# Patient Record
Sex: Female | Born: 1980 | Race: White | Hispanic: No | Marital: Married | Smoking: Never smoker
Health system: Southern US, Community
[De-identification: ages and names within clinical notes are randomized; demographics above are authoritative.]

## PROBLEM LIST (undated history)

## (undated) DIAGNOSIS — F419 Anxiety disorder, unspecified: Secondary | ICD-10-CM

## (undated) DIAGNOSIS — R112 Nausea with vomiting, unspecified: Secondary | ICD-10-CM

## (undated) DIAGNOSIS — Z9889 Other specified postprocedural states: Secondary | ICD-10-CM

## (undated) HISTORY — DX: Anxiety disorder, unspecified: F41.9

---

## 2005-03-15 ENCOUNTER — Emergency Department: Payer: Self-pay | Admitting: Emergency Medicine

## 2007-10-26 HISTORY — PX: APPENDECTOMY: SHX54

## 2016-10-11 ENCOUNTER — Other Ambulatory Visit: Payer: Self-pay | Admitting: Oncology

## 2016-12-17 ENCOUNTER — Other Ambulatory Visit: Payer: Self-pay | Admitting: Oncology

## 2017-07-01 ENCOUNTER — Encounter: Payer: Self-pay | Admitting: Advanced Practice Midwife

## 2017-07-01 ENCOUNTER — Ambulatory Visit (INDEPENDENT_AMBULATORY_CARE_PROVIDER_SITE_OTHER): Payer: 59 | Admitting: Advanced Practice Midwife

## 2017-07-01 VITALS — BP 110/80 | HR 72 | Ht 64.0 in | Wt 183.0 lb

## 2017-07-01 DIAGNOSIS — Z30431 Encounter for routine checking of intrauterine contraceptive device: Secondary | ICD-10-CM

## 2017-07-01 DIAGNOSIS — R102 Pelvic and perineal pain: Secondary | ICD-10-CM | POA: Diagnosis not present

## 2017-07-01 NOTE — Progress Notes (Signed)
       IUD String Check  Subjctive: Laura Watson presents for IUD string check.  She had a Mirena placed 3 years ago.  Since placement of her IUD she had no vaginal bleeding.  She admits to cramping or discomfort in the past week. She denies any other gyn concerns, no PAP or STD concerns. She has not been back for an annual check since placement of the IUD  She denies any fever, chills, nausea, vomiting, or other complaints. She denies any s/s of UTI.  Objective: BP 110/80   Pulse 72   Ht 5\' 4"  (1.626 m)   Wt 183 lb (83 kg)   LMP  (LMP Unknown) Comment: iud  BMI 31.41 kg/m  Gen:  NAD, A&Ox3 HEENT: normocephalic, anicteric Pulmonary: no increased work of breathing Pelvic: Normal appearing external female genitalia, normal vaginal epithelium, brown/mucous discharge. Normal appearing cervix.  IUD strings visible and 3 cm in length similar to at the time of placement. Psychiatric: mood appropriate, affect full Neurologic: grossly normal   Assessment: 36 y.o. year old female status post prior Mirena IUD placement 3 years ago with pelvic pain and some evidence that she will have some bleeding or spotting.  Plan: 1.  She should call for fevers, chills, abnormal vaginal discharge, pelvic pain, or other complaints. 2.  Recommend she schedule an annual exam.  All questions answered.  Rod Can, CNM 07/01/2017 2:40 PM

## 2017-07-07 ENCOUNTER — Emergency Department
Admission: EM | Admit: 2017-07-07 | Discharge: 2017-07-07 | Disposition: A | Discharge status: Home / Self Care | Payer: 59 | Attending: Emergency Medicine | Admitting: Emergency Medicine

## 2017-07-07 ENCOUNTER — Emergency Department: Payer: 59

## 2017-07-07 ENCOUNTER — Encounter: Payer: Self-pay | Admitting: *Deleted

## 2017-07-07 DIAGNOSIS — R51 Headache: Secondary | ICD-10-CM | POA: Insufficient documentation

## 2017-07-07 DIAGNOSIS — Z79899 Other long term (current) drug therapy: Secondary | ICD-10-CM | POA: Insufficient documentation

## 2017-07-07 DIAGNOSIS — R519 Headache, unspecified: Secondary | ICD-10-CM

## 2017-07-07 LAB — BASIC METABOLIC PANEL
ANION GAP: 9 (ref 5–15)
BUN: 10 mg/dL (ref 6–20)
CHLORIDE: 107 mmol/L (ref 101–111)
CO2: 24 mmol/L (ref 22–32)
Calcium: 9 mg/dL (ref 8.9–10.3)
Creatinine, Ser: 0.7 mg/dL (ref 0.44–1.00)
GFR calc Af Amer: >60 mL/min (ref >60)
GFR calc non Af Amer: >60 mL/min (ref >60)
GLUCOSE: 89 mg/dL (ref 65–99)
POTASSIUM: 3.6 mmol/L (ref 3.5–5.1)
Sodium: 140 mmol/L (ref 135–145)

## 2017-07-07 LAB — CBC WITH DIFFERENTIAL/PLATELET
BASOS ABS: 0.1 10*3/uL (ref 0–0.1)
Basophils Relative: 1 %
EOS PCT: 1 %
Eosinophils Absolute: 0.1 10*3/uL (ref 0–0.7)
HEMATOCRIT: 38.9 % (ref 35.0–47.0)
HEMOGLOBIN: 13.1 g/dL (ref 12.0–16.0)
LYMPHS ABS: 3 10*3/uL (ref 1.0–3.6)
LYMPHS PCT: 22 %
MCH: 29.7 pg (ref 26.0–34.0)
MCHC: 33.7 g/dL (ref 32.0–36.0)
MCV: 88.3 fL (ref 80.0–100.0)
Monocytes Absolute: 0.7 10*3/uL (ref 0.2–0.9)
Monocytes Relative: 5 %
NEUTROS ABS: 9.7 10*3/uL — AB (ref 1.4–6.5)
NEUTROS PCT: 71 %
Platelets: 416 10*3/uL (ref 150–440)
RBC: 4.4 MIL/uL (ref 3.80–5.20)
RDW: 13.5 % (ref 11.5–14.5)
WBC: 13.5 10*3/uL — ABNORMAL HIGH (ref 3.6–11.0)

## 2017-07-07 LAB — URINALYSIS, COMPLETE (UACMP) WITH MICROSCOPIC
Bilirubin Urine: NEGATIVE
GLUCOSE, UA: NEGATIVE mg/dL
Hgb urine dipstick: NEGATIVE
KETONES UR: NEGATIVE mg/dL
LEUKOCYTES UA: NEGATIVE
Nitrite: NEGATIVE
PROTEIN: NEGATIVE mg/dL
Specific Gravity, Urine: 1.015 (ref 1.005–1.030)
pH: 7 (ref 5.0–8.0)

## 2017-07-07 LAB — POCT PREGNANCY, URINE: PREG TEST UR: NEGATIVE

## 2017-07-07 MED ORDER — KETOROLAC TROMETHAMINE 30 MG/ML IJ SOLN: 15.0000 mg | Freq: Once | INTRAMUSCULAR | Status: DC

## 2017-07-07 MED ORDER — SODIUM CHLORIDE 0.9 % IV BOLUS (SEPSIS)
1000.0000 mL | Freq: Once | INTRAVENOUS | Status: AC
Start: 2017-07-07 — End: 2017-07-07
  Administered 2017-07-07: 1000 mL via INTRAVENOUS

## 2017-07-07 MED ORDER — KETOROLAC TROMETHAMINE 30 MG/ML IJ SOLN
15.0000 mg | Freq: Once | INTRAMUSCULAR | Status: AC
Start: 2017-07-07 — End: 2017-07-07
  Administered 2017-07-07: 15 mg via INTRAVENOUS
  Filled 2017-07-07: qty 1

## 2017-07-07 MED ORDER — DIPHENHYDRAMINE HCL 50 MG/ML IJ SOLN
25.0000 mg | Freq: Once | INTRAMUSCULAR | Status: AC
  Administered 2017-07-07: 25 mg via INTRAVENOUS
  Filled 2017-07-07: qty 1

## 2017-07-07 MED ORDER — METOCLOPRAMIDE HCL 5 MG/ML IJ SOLN
10.0000 mg | Freq: Once | INTRAMUSCULAR | Status: AC
  Administered 2017-07-07: 10 mg via INTRAVENOUS
  Filled 2017-07-07: qty 2

## 2017-07-07 NOTE — ED Triage Notes (Signed)
Pt has a headache for 5 days.  Pt was seen at [facility] this week and started on meds.  States no relief of pain.  Pt has nausea.  No v/d.  Pt alert.   Speech clear.

## 2017-07-07 NOTE — Discharge Instructions (Signed)
You have been seen in the Emergency Department (ED) for a headache. Your evaluation today was overall reassuring. Headaches have many possible causes. Most headaches aren't a sign of a more serious problem, and they will get better on their own.   Follow-up with your doctor in 12-24 hours if you are still having a headache. Otherwise follow up with your doctor in 3-5 days.  For pain take 600 mg of ibuprofen every 6 hours or 1000mg  of tylenol every 8 hours  When should you call for help?  Call 911 or return to the ED anytime you think you may need emergency care. For example, call if:  You have signs of a stroke. These may include:  Sudden numbness, paralysis, or weakness in your face, arm, or leg, especially on only one side of your body.  Sudden vision changes.  Sudden trouble speaking.  Sudden confusion or trouble understanding simple statements.  Sudden problems with walking or balance.  A sudden, severe headache that is different from past headaches. You have new or worsening headache Nausea and vomiting associated with your headache Fever, neck stiffness associated with your headache  Call your doctor now or seek immediate medical care if:  You have a new or worse headache.  Your headache gets much worse.  How can you care for yourself at home?  Do not drive if you have taken a prescription pain medicine.  Rest in a quiet, dark room until your headache is gone. Close your eyes and try to relax or go to sleep. Don't watch TV or read.  Put a cold, moist cloth or cold pack on the painful area for 10 to 20 minutes at a time. Put a thin cloth between the cold pack and your skin.  Use a warm, moist towel or a heating pad set on low to relax tight shoulder and neck muscles.  Have someone gently massage your neck and shoulders.  Take pain medicines exactly as directed.  If the doctor gave you a prescription medicine for pain, take it as prescribed.  If you are not taking a prescription  pain medicine, ask your doctor if you can take an over-the-counter medicine. Be careful not to take pain medicine more often than the instructions allow, because you may get worse or more frequent headaches when the medicine wears off.  Do not ignore new symptoms that occur with a headache, such as a fever, weakness or numbness, vision changes, or confusion. These may be signs of a more serious problem.  To prevent headaches  Keep a headache diary so you can figure out what triggers your headaches. Avoiding triggers may help you prevent headaches. Record when each headache began, how long it lasted, and what the pain was like (throbbing, aching, stabbing, or dull). Write down any other symptoms you had with the headache, such as nausea, flashing lights or dark spots, or sensitivity to bright light or loud noise. Note if the headache occurred near your period. List anything that might have triggered the headache, such as certain foods (chocolate, cheese, wine) or odors, smoke, bright light, stress, or lack of sleep.  Find healthy ways to deal with stress. Headaches are most common during or right after stressful times. Take time to relax before and after you do something that has caused a headache in the past.  Try to keep your muscles relaxed by keeping good posture. Check your jaw, face, neck, and shoulder muscles for tension, and try relaxing them. When sitting at a  desk, change positions often, and stretch for 30 seconds each hour.  Get plenty of sleep and exercise.  Eat regularly and well. Long periods without food can trigger a headache.  Treat yourself to a massage. Some people find that regular massages are very helpful in relieving tension.  Limit caffeine by not drinking too much coffee, tea, or soda. But don't quit caffeine suddenly, because that can also give you headaches.  Reduce eyestrain from computers by blinking frequently and looking away from the computer screen every so often. Make  sure you have proper eyewear and that your monitor is set up properly, about an arm's length away.  Seek help if you have depression or anxiety. Your headaches may be linked to these conditions. Treatment can both prevent headaches and help with symptoms of anxiety or depression.

## 2017-07-07 NOTE — ED Provider Notes (Signed)
[facility] Emergency Department Provider Note  ____________________________________________  Time seen: Approximately 6:04 PM  I have reviewed the triage vital signs and the nursing notes.   HISTORY  Chief Complaint Headache   HPI Laura Watson is a 36 y.o. female no significant past medical history who presents for evaluation of headache. Patient reports that she has had a headache constantly for 5 days. She describes it as throbbing, located in the base of her skull, radiating to the top of her head, worse when she turns her head to the left, currently 8 out of 10. She has had nausea but no vomiting, no photophobia. No fh aneurysms, brain cancer, or stroke. She denies having history of chronic headaches or migraines. She denies ever having similar headache. 2 days ago she went to urgent care and she was started on Fioricet, Sudafed, and Afrin for possible sinus headache. She denies congestion, cough, fever. The HA started before bedtime 5 days ago. Patient was home getting ready for bed. Patient denies changes in vision, weakness or numbness, sudden onset HA or HA with maximal intensity at onset, fever, neck stiffness, history of immunosuppression, Jaw claudication, muscle aches, temporal artery pain, history of other household members with similar symptoms, pregnancy, clotting disorder, trauma, eye pain, recent cervical manipulation, or dizziness with headache.   No past medical history on file.  There are no active problems to display for this patient.   Past Surgical History:  Procedure Laterality Date  . APPENDECTOMY  2009  . CESAREAN SECTION     x2    Prior to Admission medications   Medication Sig Start Date End Date Taking? Authorizing Provider  acetaminophen (TYLENOL) 325 MG tablet Take 650 mg by mouth as needed.   Yes [provider]  Butalbital-APAP-Caffeine 50-300-40 MG CAPS Take 1 tablet by mouth daily as needed. 07/05/17  Yes  [provider]  fluticasone (FLONASE) 50 MCG/ACT nasal spray Place 1-2 sprays into both nostrils daily. 07/06/17  Yes [provider]  ibuprofen (ADVIL,MOTRIN) 200 MG tablet Take 400 mg by mouth as needed.   Yes [provider]  levonorgestrel (MIRENA) 20 MCG/24HR IUD 1 Intra Uterine Device by Intrauterine route continuous.   Yes [provider]    Allergies Patient has no known allergies.  Family History  Problem Relation Age of Onset  . Diabetes Mother   . Cancer Mother 51       lymphoma  . Diabetes Father     Social History Social History  Substance Use Topics  . Smoking status: Never Smoker  . Smokeless tobacco: Never Used  . Alcohol use Yes     Comment: occ    Review of Systems  Constitutional: Negative for fever. Eyes: Negative for visual changes. ENT: Negative for sore throat. Neck: No neck pain  Cardiovascular: Negative for chest pain. Respiratory: Negative for shortness of breath. Gastrointestinal: Negative for abdominal pain, vomiting or diarrhea. Genitourinary: Negative for dysuria. Musculoskeletal: Negative for back pain. Skin: Negative for rash. Neurological: Negative for weakness or numbness. + HA Psych: No SI or HI  ____________________________________________   PHYSICAL EXAM:  VITAL SIGNS: ED Triage Vitals  Enc Vitals Group     BP 07/07/17 1606 (!) 143/78     Pulse Rate 07/07/17 1606 83     Resp 07/07/17 1606 16     Temp 07/07/17 1606 98.6 F (37 C)     Temp Source 07/07/17 1606 Oral     SpO2 07/07/17 1606 100 %  Weight 07/07/17 1607 180 lb (81.6 kg)     Height 07/07/17 1607 5\' 4"  (1.626 m)     Head Circumference --      Peak Flow --      Pain Score 07/07/17 1605 9     Pain Loc --      Pain Edu? --      Excl. in [place]? --     Constitutional: Alert and oriented. Well appearing and in no apparent distress. HEENT:      Head: Normocephalic and atraumatic.         Eyes: Conjunctivae are normal. Sclera  is non-icteric.       Mouth/Throat: Mucous membranes are moist.       Neck: Supple with no signs of meningismus. No neck stiffness or torticollis. Cardiovascular: Regular rate and rhythm. No murmurs, gallops, or rubs. 2+ symmetrical distal pulses are present in all extremities. No JVD. Respiratory: Normal respiratory effort. Lungs are clear to auscultation bilaterally. No wheezes, crackles, or rhonchi.  Gastrointestinal: Soft, non tender, and non distended with positive bowel sounds. No rebound or guarding. Genitourinary: No CVA tenderness. Musculoskeletal: Nontender with normal range of motion in all extremities. No edema, cyanosis, or erythema of extremities. Neurologic: Normal speech and language. A & O x3, EOMI, PERRL, no nystagmus, CN II-XII intact, motor testing reveals good tone and bulk throughout. There is no evidence of pronator drift or dysmetria. Muscle strength is 5/5 throughout. Sensory examination is intact. Gait is normal. Skin: Skin is warm, dry and intact. No rash noted. Psychiatric: Mood and affect are normal. Speech and behavior are normal.  ____________________________________________   LABS (all labs ordered are listed, but only abnormal results are displayed)  Labs Reviewed  CBC WITH DIFFERENTIAL/PLATELET - Abnormal; Notable for the following:       Result Value   WBC 13.5 (*)    Neutro Abs 9.7 (*)    All other components within normal limits  URINALYSIS, COMPLETE (UACMP) WITH MICROSCOPIC - Abnormal; Notable for the following:    Squamous Epithelial / LPF 0-5 (*)    Bacteria, UA RARE (*)    All other components within normal limits  BASIC METABOLIC PANEL  POCT PREGNANCY, URINE   ____________________________________________  EKG  none ____________________________________________  RADIOLOGY  Head CT: Negative head CT. ____________________________________________   PROCEDURES  Procedure(s) performed: None Procedures Critical Care performed:   None ____________________________________________   INITIAL IMPRESSION / ASSESSMENT AND PLAN / ED COURSE  36 y.o. female no significant past medical history who presents for evaluation of headache x 5 days located in the posterior aspect at the base of the skull and radiating to the top of her head.patient is well-appearing, she has normal vital signs, her neck is soft with no meningeal signs, no evidence of torticollis, she is neurologically intact. This patient has had this headache for 5 days and despite trying several different medications at home, will send her for head CT and check basic blood work. If pregnancy test is negative and CT is negative we'll treat with Toradol, Reglan, fluids, and Benadryl.   _________________________ 8:08 PM on 07/07/2017 -----------------------------------------  labs in no acute findings other than mild leukocytosis.patient has had no fever, no URI symptoms, no neck stiffness, no rash. Unclear why her white count is slightly elevated but there is no signs or symptoms of infection at this time. CT head with no acute findings. Her headache has fully resolved with a migraine cocktail patient is requesting discharge. She is to follow-up  with her primary care doctor. Recommend he return precautions for new or worsening headaches.  Pertinent labs & imaging results that were available during my care of the patient were reviewed by me and considered in my medical decision making (see chart for details).    ____________________________________________   FINAL CLINICAL IMPRESSION(S) / ED DIAGNOSES  Final diagnoses:  Acute nonintractable headache, unspecified headache type      NEW MEDICATIONS STARTED DURING THIS VISIT:  New Prescriptions   No medications on file     Note:  This document was prepared using Dragon voice recognition software and may include unintentional dictation errors.    Rudene Re, MD 07/07/17 2009

## 2017-07-07 NOTE — ED Notes (Signed)

## 2018-01-16 ENCOUNTER — Telehealth: Payer: Self-pay | Admitting: Family Medicine

## 2018-01-16 ENCOUNTER — Ambulatory Visit: Payer: BLUE CROSS/BLUE SHIELD | Admitting: Family Medicine

## 2018-01-16 ENCOUNTER — Encounter: Payer: Self-pay | Admitting: Family Medicine

## 2018-01-16 VITALS — BP 122/80 | HR 84 | Temp 98.1°F | Resp 16 | Ht 64.0 in | Wt 180.2 lb

## 2018-01-16 DIAGNOSIS — E6609 Other obesity due to excess calories: Secondary | ICD-10-CM

## 2018-01-16 DIAGNOSIS — R5383 Other fatigue: Secondary | ICD-10-CM

## 2018-01-16 DIAGNOSIS — Z6830 Body mass index (BMI) 30.0-30.9, adult: Secondary | ICD-10-CM

## 2018-01-16 DIAGNOSIS — F321 Major depressive disorder, single episode, moderate: Secondary | ICD-10-CM | POA: Diagnosis not present

## 2018-01-16 DIAGNOSIS — Z131 Encounter for screening for diabetes mellitus: Secondary | ICD-10-CM

## 2018-01-16 DIAGNOSIS — Z113 Encounter for screening for infections with a predominantly sexual mode of transmission: Secondary | ICD-10-CM | POA: Diagnosis not present

## 2018-01-16 DIAGNOSIS — Z0001 Encounter for general adult medical examination with abnormal findings: Secondary | ICD-10-CM

## 2018-01-16 DIAGNOSIS — F419 Anxiety disorder, unspecified: Secondary | ICD-10-CM | POA: Insufficient documentation

## 2018-01-16 DIAGNOSIS — Z8639 Personal history of other endocrine, nutritional and metabolic disease: Secondary | ICD-10-CM | POA: Diagnosis not present

## 2018-01-16 DIAGNOSIS — Z0000 Encounter for general adult medical examination without abnormal findings: Secondary | ICD-10-CM

## 2018-01-16 DIAGNOSIS — Z13220 Encounter for screening for lipoid disorders: Secondary | ICD-10-CM

## 2018-01-16 DIAGNOSIS — F3340 Major depressive disorder, recurrent, in remission, unspecified: Secondary | ICD-10-CM | POA: Insufficient documentation

## 2018-01-16 DIAGNOSIS — Z833 Family history of diabetes mellitus: Secondary | ICD-10-CM | POA: Insufficient documentation

## 2018-01-16 MED ORDER — ESCITALOPRAM OXALATE 10 MG PO TABS: ORAL_TABLET | ORAL | 0 refills | Status: DC

## 2018-01-16 NOTE — Telephone Encounter (Signed)
Please request records from [facility] her most recent Pap, and it is not available in the electronic record - she thinks it may have been prior to 2015.

## 2018-01-16 NOTE — Telephone Encounter (Signed)
Last pap  was 06/02/2012 and negative
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
## 2018-01-16 NOTE — Telephone Encounter (Signed)
Please call patient and notify her that she is due for Pap - she may go back to [facility], or we can perform here, but it is very important that she has this done in a timely manner. Thanks!

## 2018-01-16 NOTE — Patient Instructions (Addendum)
- Psychology Today Find a Transport planner.  - Start Lexapro 44m for 7 days (1/2 tablet), then increase to 141mdaily (1 tablet)  - Please obtain your TDAP records from [facility] that we may record this in your chart. Preventive Care 18-39 Years, Female Preventive care refers to lifestyle choices and visits with your health care provider that can promote health and wellness. What does preventive care include?  A yearly physical exam. This is also called an annual well check.  Dental exams once or twice a year.  Routine eye exams. Ask your health care provider how often you should have your eyes checked.  Personal lifestyle choices, including: ? Daily care of your teeth and gums. ? Regular physical activity. ? Eating a healthy diet. ? Avoiding tobacco and drug use. ? Limiting alcohol use. ? Practicing safe sex. ? Taking vitamin and mineral supplements as recommended by your health care provider. What happens during an annual well check? The services and screenings done by your health care provider during your annual well check will depend on your age, overall health, lifestyle risk factors, and family history of disease. Counseling Your health care provider may ask you questions about your:  Alcohol use.  Tobacco use.  Drug use.  Emotional well-being.  Home and relationship well-being.  Sexual activity.  Eating habits.  Work and work enStatistician Method of birth control.  Menstrual cycle.  Pregnancy history.  Screening You may have the following tests or measurements:  Height, weight, and BMI.  Diabetes screening. This is done by checking your blood sugar (glucose) after you have not eaten for a while (fasting).  Blood pressure.  Lipid and cholesterol levels. These may be checked every 5 years starting at age 360 Skin check.  Hepatitis C blood test.  Hepatitis B blood test.  Sexually transmitted disease (STD) testing.  BRCA-related  cancer screening. This may be done if you have a family history of breast, ovarian, tubal, or peritoneal cancers.  Pelvic exam and Pap test. This may be done every 3 years starting at age 5567Starting at age 6869this may be done every 5 years if you have a Pap test in combination with an HPV test.  Discuss your test results, treatment options, and if necessary, the need for more tests with your health care provider. Vaccines Your health care provider may recommend certain vaccines, such as:  Influenza vaccine. This is recommended every year.  Tetanus, diphtheria, and acellular pertussis (Tdap, Td) vaccine. You may need a Td booster every 10 years.  Varicella vaccine. You may need this if you have not been vaccinated.  HPV vaccine. If you are 265r younger, you may need three doses over 6 months.  Measles, mumps, and rubella (MMR) vaccine. You may need at least one dose of MMR. You may also need a second dose.  Pneumococcal 13-valent conjugate (PCV13) vaccine. You may need this if you have certain conditions and were not previously vaccinated.  Pneumococcal polysaccharide (PPSV23) vaccine. You may need one or two doses if you smoke cigarettes or if you have certain conditions.  Meningococcal vaccine. One dose is recommended if you are age 87-21 years and a first-year college student living in a residence hall, or if you have one of several medical conditions. You may also need additional booster doses.  Hepatitis A vaccine. You may need this if you have certain conditions or if you travel or work in places where you may be exposed  to hepatitis A.  Hepatitis B vaccine. You may need this if you have certain conditions or if you travel or work in places where you may be exposed to hepatitis B.  Haemophilus influenzae type b (Hib) vaccine. You may need this if you have certain risk factors.  Talk to your health care provider about which screenings and vaccines you need and how often you need  them. This information is not intended to replace advice given to you by your health care provider. Make sure you discuss any questions you have with your health care provider. Document Released: 12/07/2001 Document Revised: 06/30/2016 Document Reviewed: 08/12/2015 Elsevier Interactive Patient Education  [employer].

## 2018-01-17 LAB — COMPREHENSIVE METABOLIC PANEL
ALT: 14 IU/L (ref 0–32)
AST: 15 IU/L (ref 0–40)
Albumin/Globulin Ratio: 1.8 (ref 1.2–2.2)
Albumin: 4.7 g/dL (ref 3.5–5.5)
Alkaline Phosphatase: 81 IU/L (ref 39–117)
BUN/Creatinine Ratio: 9 (ref 9–23)
BUN: 6 mg/dL (ref 6–20)
Bilirubin Total: 0.5 mg/dL (ref 0.0–1.2)
CALCIUM: 9.7 mg/dL (ref 8.7–10.2)
CO2: 24 mmol/L (ref 20–29)
CREATININE: 0.7 mg/dL (ref 0.57–1.00)
Chloride: 105 mmol/L (ref 96–106)
GFR calc Af Amer: 128 mL/min/{1.73_m2} (ref >59)
GFR, EST NON AFRICAN AMERICAN: 111 mL/min/{1.73_m2} (ref >59)
GLOBULIN, TOTAL: 2.6 g/dL (ref 1.5–4.5)
Glucose: 75 mg/dL (ref 65–99)
Potassium: 4.5 mmol/L (ref 3.5–5.2)
Sodium: 142 mmol/L (ref 134–144)
Total Protein: 7.3 g/dL (ref 6.0–8.5)

## 2018-01-17 LAB — CBC
Hematocrit: 40.8 % (ref 34.0–46.6)
Hemoglobin: 14 g/dL (ref 11.1–15.9)
MCH: 30.6 pg (ref 26.6–33.0)
MCHC: 34.3 g/dL (ref 31.5–35.7)
MCV: 89 fL (ref 79–97)
PLATELETS: 402 10*3/uL — AB (ref 150–379)
RBC: 4.58 x10E6/uL (ref 3.77–5.28)
RDW: 14 % (ref 12.3–15.4)
WBC: 11.7 10*3/uL — AB (ref 3.4–10.8)

## 2018-01-17 LAB — RPR: RPR Ser Ql: NONREACTIVE

## 2018-01-17 LAB — LIPID PANEL
CHOL/HDL RATIO: 3.5 ratio (ref 0.0–4.4)
CHOLESTEROL TOTAL: 176 mg/dL (ref 100–199)
HDL: 50 mg/dL (ref >39)
LDL Calculated: 110 mg/dL — ABNORMAL HIGH (ref 0–99)
Triglycerides: 79 mg/dL (ref 0–149)
VLDL Cholesterol Cal: 16 mg/dL (ref 5–40)

## 2018-01-17 LAB — GC/CHLAMYDIA PROBE AMP
Chlamydia trachomatis, NAA: NEGATIVE
NEISSERIA GONORRHOEAE BY PCR: NEGATIVE

## 2018-01-17 LAB — HIV ANTIBODY (ROUTINE TESTING W REFLEX): HIV Screen 4th Generation wRfx: NONREACTIVE

## 2018-01-17 LAB — TSH: TSH: 1.83 u[IU]/mL (ref 0.450–4.500)

## 2018-01-17 LAB — VITAMIN D 25 HYDROXY (VIT D DEFICIENCY, FRACTURES): Vit D, 25-Hydroxy: 10.7 ng/mL — ABNORMAL LOW (ref 30.0–100.0)

## 2018-01-23 ENCOUNTER — Other Ambulatory Visit: Payer: Self-pay | Admitting: Family Medicine

## 2018-01-23 MED ORDER — VITAMIN D (ERGOCALCIFEROL) 1.25 MG (50000 UNIT) PO CAPS: 50000.0000 [IU] | ORAL_CAPSULE | ORAL | 0 refills | Status: DC

## 2018-01-27 ENCOUNTER — Telehealth: Payer: Self-pay | Admitting: Family Medicine

## 2018-01-27 NOTE — Telephone Encounter (Signed)
Patient notified of lab results- and Rx at pharmacy- she also modified she needs pap.  Lab not in basket

## 2018-01-31 ENCOUNTER — Ambulatory Visit: Payer: BLUE CROSS/BLUE SHIELD | Admitting: Family Medicine

## 2018-02-01 NOTE — Telephone Encounter (Signed)
Left message for patient to call office.  

## 2018-02-16 ENCOUNTER — Telehealth: Payer: Self-pay | Admitting: Family Medicine

## 2018-02-16 DIAGNOSIS — F321 Major depressive disorder, single episode, moderate: Secondary | ICD-10-CM

## 2018-02-16 DIAGNOSIS — F419 Anxiety disorder, unspecified: Secondary | ICD-10-CM

## 2018-02-16 MED ORDER — ESCITALOPRAM OXALATE 10 MG PO TABS: 10.0000 mg | ORAL_TABLET | Freq: Every day | ORAL | 0 refills | Status: DC

## 2018-02-16 NOTE — Telephone Encounter (Signed)
Copied from [place]. Topic: Quick Communication - Rx Refill/Question >> Feb 16, 2018  1:14 PM Bea Graff, NT wrote: Medication: escitalopram (LEXAPRO) 10 MG tablet Has the patient contacted their pharmacy? Yes.   (Agent: If no, request that the patient contact the pharmacy for the refill.) Preferred Pharmacy (with phone number or street name): [facility] [place], [phone] (Phone) [phone] (Fax)     Agent: Please be advised that RX refills may take up to 3 business days. We ask that you follow-up with your pharmacy.

## 2018-02-16 NOTE — Telephone Encounter (Signed)
Laura Watson is out I reviewed last note from 01/16/18 Patient was started on lexapro and was supposed to f/u 2 weeks later Patient needs to be seen, Benjamine Mola or Dr. Ancil Boozer; I am going to be gone and Laura Watson is not avaialble Need to make sure no mania or hypomania with addition of SSRI and see how she's doing on it I provided 7 days of medicine

## 2018-02-17 NOTE — Telephone Encounter (Signed)
Pt has an appt for 03-03-18

## 2018-02-22 ENCOUNTER — Other Ambulatory Visit: Payer: Self-pay | Admitting: Nurse Practitioner

## 2018-02-22 DIAGNOSIS — F321 Major depressive disorder, single episode, moderate: Secondary | ICD-10-CM

## 2018-02-22 DIAGNOSIS — F419 Anxiety disorder, unspecified: Secondary | ICD-10-CM

## 2018-02-22 MED ORDER — ESCITALOPRAM OXALATE 10 MG PO TABS: 10.0000 mg | ORAL_TABLET | Freq: Every day | ORAL | 0 refills | Status: DC

## 2018-02-22 NOTE — Telephone Encounter (Signed)
Sent 10 day supply to [facility] to continue med daily until appointment on 5/10

## 2018-02-22 NOTE — Telephone Encounter (Signed)
Patient will need a few more tablets to last until appointment

## 2018-03-03 ENCOUNTER — Encounter: Payer: Self-pay | Admitting: Family Medicine

## 2018-03-03 ENCOUNTER — Ambulatory Visit: Payer: BLUE CROSS/BLUE SHIELD | Admitting: Family Medicine

## 2018-03-03 VITALS — BP 118/72 | HR 110 | Temp 98.7°F | Resp 16 | Ht 64.0 in | Wt 177.2 lb

## 2018-03-03 DIAGNOSIS — J029 Acute pharyngitis, unspecified: Secondary | ICD-10-CM | POA: Diagnosis not present

## 2018-03-03 DIAGNOSIS — B950 Streptococcus, group A, as the cause of diseases classified elsewhere: Secondary | ICD-10-CM

## 2018-03-03 DIAGNOSIS — F419 Anxiety disorder, unspecified: Secondary | ICD-10-CM

## 2018-03-03 DIAGNOSIS — R41840 Attention and concentration deficit: Secondary | ICD-10-CM

## 2018-03-03 DIAGNOSIS — F321 Major depressive disorder, single episode, moderate: Secondary | ICD-10-CM

## 2018-03-03 LAB — POCT RAPID STREP A (OFFICE): Rapid Strep A Screen: POSITIVE — AB

## 2018-03-03 MED ORDER — LIDOCAINE VISCOUS 2 % MT SOLN: 15.0000 mL | OROMUCOSAL | 0 refills | Status: DC | PRN

## 2018-03-03 MED ORDER — PENICILLIN V POTASSIUM 500 MG PO TABS
500.0000 mg | ORAL_TABLET | Freq: Three times a day (TID) | ORAL | 0 refills | Status: DC
Start: 2018-03-03 — End: 2018-04-08

## 2018-03-03 MED ORDER — ESCITALOPRAM OXALATE 10 MG PO TABS
10.0000 mg | ORAL_TABLET | Freq: Every day | ORAL | 3 refills | Status: DC
Start: 2018-03-03 — End: 2019-01-18

## 2018-03-03 MED ORDER — VITAMIN D (ERGOCALCIFEROL) 1.25 MG (50000 UNIT) PO CAPS: 50000.0000 [IU] | ORAL_CAPSULE | ORAL | 1 refills | Status: AC

## 2018-03-03 MED ORDER — VITAMIN D (ERGOCALCIFEROL) 1.25 MG (50000 UNIT) PO CAPS: 50000.0000 [IU] | ORAL_CAPSULE | ORAL | Status: DC

## 2018-03-03 NOTE — Progress Notes (Signed)
BP 118/72   Pulse (!) 110   Temp 98.7 F (37.1 C) (Oral)   Resp 16   Ht 5\' 4"  (1.626 m)   Wt 177 lb 3.2 oz (80.4 kg)   SpO2 98%   BMI 30.42 kg/m    Subjective:    Patient ID: Laura Watson, female    DOB: [birth year], 37 y.o.   MRN: [id number]  HPI: Laura Watson is a 37 y.o. female  Chief Complaint  Patient presents with  . Follow-up  . Depression    started on lexapro and working  . Sore Throat    body aches, fever and throat swollen right side    HPI She is here for an acute visit Temp 101.8 last night; feels like strep, but worse than previous episode Sore throat started yesterday, thought maybe with allergies No known strep contacts; daughter is 47 and son is almost 24 She worked today  She had depression and was started on lexapro She had a lot of anxiety and she sometimes feels overwhelmed, but not all day every day Much better No mania or hypomania Would like to continue this  She feels like she drifts a lot; wonders if attention; focusing on computer and mind drifts somewhere else for periods of time; going on for a year and a half; even notices it at home; can't focus and pay attention to things Works at [employer]; in the middle of a lab; ventilated area; symptoms don't change if on vacation Cannot focus on books any more   Depression screen [facility] 2/9 03/03/2018 01/16/2018  Decreased Interest 0 1  Down, Depressed, Hopeless 0 1  PHQ - 2 Score 0 2  Altered sleeping - 3  Tired, decreased energy - 3  Change in appetite - 2  Feeling bad or failure about yourself  - 1  Trouble concentrating - 3  Moving slowly or fidgety/restless - 0  Suicidal thoughts - 0  PHQ-9 Score - 14  Difficult doing work/chores - Somewhat difficult    Relevant past medical, surgical, family and social history reviewed History reviewed. No pertinent past medical history. Past Surgical History:  Procedure Laterality Date  . APPENDECTOMY  2009  . CESAREAN SECTION     x2    Family History  Problem Relation Age of Onset  . Diabetes Mother   . Cancer Mother 1       lymphoma  . Diabetes Father   . Pulmonary fibrosis Father   . Diabetes Sister   . Diabetes Brother   . Dementia Maternal Grandmother   . Alzheimer's disease Maternal Grandfather    Social History   Tobacco Use  . Smoking status: Never Smoker  . Smokeless tobacco: Never Used  Substance Use Topics  . Alcohol use: Yes    Comment: occ  . Drug use: No    Interim medical history since last visit reviewed. Allergies and medications reviewed  Review of Systems Per HPI unless specifically indicated above     Objective:    BP 118/72   Pulse (!) 110   Temp 98.7 F (37.1 C) (Oral)   Resp 16   Ht 5\' 4"  (1.626 m)   Wt 177 lb 3.2 oz (80.4 kg)   SpO2 98%   BMI 30.42 kg/m   Wt Readings from Last 3 Encounters:  03/03/18 177 lb 3.2 oz (80.4 kg)  01/16/18 180 lb 3.2 oz (81.7 kg)  07/07/17 180 lb (81.6 kg)    Physical Exam  Constitutional:  She appears well-developed and well-nourished. No distress.  HENT:  Head: Normocephalic and atraumatic.  Right Ear: Tympanic membrane and ear canal normal.  Left Ear: Tympanic membrane and ear canal normal.  Nose: No rhinorrhea.  Mouth/Throat: Posterior oropharyngeal edema and posterior oropharyngeal erythema present. No oropharyngeal exudate.  Eyes: EOM are normal. No scleral icterus.  Neck: No thyromegaly present.  Cardiovascular: Regular rhythm and normal heart sounds.  No extrasystoles are present. Tachycardia present.  No murmur heard. Pulmonary/Chest: Effort normal and breath sounds normal. No respiratory distress. She has no wheezes.  Abdominal: Soft. Bowel sounds are normal. She exhibits no distension.  Musculoskeletal: Normal range of motion. She exhibits no edema.  Lymphadenopathy:    She has cervical adenopathy.  Neurological: She is alert. She exhibits normal muscle tone.  Skin: Skin is warm and dry. She is not diaphoretic. No  pallor.  Psychiatric: She has a normal mood and affect. Her behavior is normal. Judgment and thought content normal.        Assessment & Plan:   Problem List Items Addressed This Visit      Other   Anxiety disorder   Relevant Medications   escitalopram (LEXAPRO) 10 MG tablet   Current moderate episode of major depressive disorder without prior episode ([place])    lexapro for treatment      Relevant Medications   escitalopram (LEXAPRO) 10 MG tablet    Other Visit Diagnoses    Group A streptococcal infection    -  Primary   positive rapid strep; treat with antibiotics; symptomatic care as well; may use viscous lidocaine if needed; spit, do not swallow; contagious   Sore throat       rapid strep ordered; positive   Relevant Orders   POCT rapid strep A (Completed)   Concentration deficit       developing in her late 18's; does NOT meet criteria for ADHD; likely related to anxiety or depression; continue lexapro       Follow up plan: No follow-ups on file.  An after-visit summary was printed and given to the patient at [facility].  Please see the patient instructions which may contain other information and recommendations beyond what is mentioned above in the assessment and plan.  Meds ordered this encounter  Medications  . penicillin v potassium (VEETID) 500 MG tablet    Sig: Take 1 tablet (500 mg total) by mouth 3 (three) times daily.    Dispense:  30 tablet    Refill:  0  . lidocaine (XYLOCAINE) 2 % solution    Sig: Use as directed 15 mLs in the mouth or throat every 4 (four) hours as needed for mouth pain. SPIT OUT when done    Dispense:  100 mL    Refill:  0  . escitalopram (LEXAPRO) 10 MG tablet    Sig: Take 1 tablet (10 mg total) by mouth daily. Appointment needed    Dispense:  90 tablet    Refill:  3  . DISCONTD: Vitamin D, Ergocalciferol, (DRISDOL) 50000 units CAPS capsule    Sig: Take 1 capsule (50,000 Units total) by mouth every 7 (seven) days.  . Vitamin D,  Ergocalciferol, (DRISDOL) 50000 units CAPS capsule    Sig: Take 1 capsule (50,000 Units total) by mouth every 7 (seven) days.    Dispense:  4 capsule    Refill:  1    Orders Placed This Encounter  Procedures  . POCT rapid strep A

## 2018-03-03 NOTE — Patient Instructions (Addendum)
Start the antibiotics today and get two of the three doses in before bed If you get worse, go to the emergency department At the pharmacy, please ask them to continue the prescription vitamin D once a week for 8 more weeks Start vitamin B12 500 or 1000 mcg daily to help with concentration If your symptoms continue after 8 weeks, return for more evaluation  Sore Throat A sore throat is pain, burning, irritation, or scratchiness in the throat. When you have a sore throat, you may feel pain or tenderness in your throat when you swallow or talk. Many things can cause a sore throat, including:  An infection.  Seasonal allergies.  Dryness in the air.  Irritants, such as smoke or pollution.  Gastroesophageal reflux disease (GERD).  A tumor.  A sore throat is often the first sign of another sickness. It may happen with other symptoms, such as coughing, sneezing, fever, and swollen neck glands. Most sore throats go away without medical treatment. Follow these instructions at home:  Take over-the-counter medicines only as told by your health care provider.  Drink enough fluids to keep your urine clear or pale yellow.  Rest as needed.  To help with pain, try: ? Sipping warm liquids, such as broth, herbal tea, or warm water. ? Eating or drinking cold or frozen liquids, such as frozen ice pops. ? Gargling with a salt-water mixture 3-4 times a day or as needed. To make a salt-water mixture, completely dissolve -1 tsp of salt in 1 cup of warm water. ? Sucking on hard candy or throat lozenges. ? Putting a cool-mist humidifier in your bedroom at night to moisten the air. ? Sitting in the bathroom with the door closed for 5-10 minutes while you run hot water in the shower.  Do not use any tobacco products, such as cigarettes, chewing tobacco, and e-cigarettes. If you need help quitting, ask your health care provider. Contact a health care provider if:  You have a fever for more than 2-3  days.  You have symptoms that last (are persistent) for more than 2-3 days.  Your throat does not get better within 7 days.  You have a fever and your symptoms suddenly get worse. Get help right away if:  You have difficulty breathing.  You cannot swallow fluids, soft foods, or your saliva.  You have increased swelling in your throat or neck.  You have persistent nausea and vomiting. This information is not intended to replace advice given to you by your health care provider. Make sure you discuss any questions you have with your health care provider. Document Released: 11/18/2004 Document Revised: 06/06/2016 Document Reviewed: 08/01/2015 Elsevier Interactive Patient Education  2018 [employer].  Strep Throat Strep throat is a bacterial infection of the throat. Your health care provider may call the infection tonsillitis or pharyngitis, depending on whether there is swelling in the tonsils or at the back of the throat. Strep throat is most common during the cold months of the year in children who are 49-77 years of age, but it can happen during any season in people of any age. This infection is spread from person to person (contagious) through coughing, sneezing, or close contact. What are the causes? Strep throat is caused by the bacteria called Streptococcus pyogenes. What increases the risk? This condition is more likely to develop in:  People who spend time in crowded places where the infection can spread easily.  People who have close contact with someone who has strep  throat.  What are the signs or symptoms? Symptoms of this condition include:  Fever or chills.  Redness, swelling, or pain in the tonsils or throat.  Pain or difficulty when swallowing.  White or yellow spots on the tonsils or throat.  Swollen, tender glands in the neck or under the jaw.  Red rash all over the body (rare).  How is this diagnosed? This condition is diagnosed by performing a rapid  strep test or by taking a swab of your throat (throat culture test). Results from a rapid strep test are usually ready in a few minutes, but throat culture test results are available after one or two days. How is this treated? This condition is treated with antibiotic medicine. Follow these instructions at home: Medicines  Take over-the-counter and prescription medicines only as told by your health care provider.  Take your antibiotic as told by your health care provider. Do not stop taking the antibiotic even if you start to feel better.  Have family members who also have a sore throat or fever tested for strep throat. They may need antibiotics if they have the strep infection. Eating and drinking  Do not share food, drinking cups, or personal items that could cause the infection to spread to other people.  If swallowing is difficult, try eating soft foods until your sore throat feels better.  Drink enough fluid to keep your urine clear or pale yellow. General instructions  Gargle with a salt-water mixture 3-4 times per day or as needed. To make a salt-water mixture, completely dissolve -1 tsp of salt in 1 cup of warm water.  Make sure that all household members wash their hands well.  Get plenty of rest.  Stay home from school or work until you have been taking antibiotics for 24 hours.  Keep all follow-up visits as told by your health care provider. This is important. Contact a health care provider if:  The glands in your neck continue to get bigger.  You develop a rash, cough, or earache.  You cough up a thick liquid that is green, yellow-brown, or bloody.  You have pain or discomfort that does not get better with medicine.  Your problems seem to be getting worse rather than better.  You have a fever. Get help right away if:  You have new symptoms, such as vomiting, severe headache, stiff or painful neck, chest pain, or shortness of breath.  You have severe throat  pain, drooling, or changes in your voice.  You have swelling of the neck, or the skin on the neck becomes red and tender.  You have signs of dehydration, such as fatigue, dry mouth, and decreased urination.  You become increasingly sleepy, or you cannot wake up completely.  Your joints become red or painful. This information is not intended to replace advice given to you by your health care provider. Make sure you discuss any questions you have with your health care provider. Document Released: 10/08/2000 Document Revised: 06/09/2016 Document Reviewed: 02/03/2015 Elsevier Interactive Patient Education  [employer].

## 2018-03-14 NOTE — Assessment & Plan Note (Signed)
lexapro for treatment

## 2018-04-08 ENCOUNTER — Ambulatory Visit
Admission: EM | Admit: 2018-04-08 | Discharge: 2018-04-08 | Disposition: A | Discharge status: Home / Self Care | Payer: BLUE CROSS/BLUE SHIELD | Attending: Family Medicine | Admitting: Family Medicine

## 2018-04-08 DIAGNOSIS — R197 Diarrhea, unspecified: Secondary | ICD-10-CM | POA: Diagnosis not present

## 2018-04-08 DIAGNOSIS — R109 Unspecified abdominal pain: Secondary | ICD-10-CM | POA: Diagnosis not present

## 2018-04-08 DIAGNOSIS — R110 Nausea: Secondary | ICD-10-CM | POA: Diagnosis not present

## 2018-04-08 MED ORDER — ONDANSETRON 8 MG PO TBDP: 8.0000 mg | ORAL_TABLET | Freq: Three times a day (TID) | ORAL | 0 refills | Status: DC | PRN

## 2018-04-08 MED ORDER — DICYCLOMINE HCL 20 MG PO TABS: 20.0000 mg | ORAL_TABLET | Freq: Two times a day (BID) | ORAL | 0 refills | Status: DC | PRN

## 2018-04-08 NOTE — Discharge Instructions (Addendum)
Recommend start Zofran 8mg  every 8 hours as needed for nausea. May take Bentyl 20mg  every 12 hours as needed for stomach cramps. Try to increase fluids and bland, carbohydrate diet for now. If increase in pain, fever, diarrhea and vomiting occur, go to the ER for further evaluation. Otherwise, follow-up pending stool culture results.

## 2018-04-08 NOTE — ED Triage Notes (Signed)
Pt here for nausea, diarrhea and abdominal pain since Wednesday. Unable to count how many times she was went to the bathroom, describes it as watery and feeling bloated in the top part of her stomach. Has been pushing fluids the best she can. No otc meds tried.

## 2018-04-08 NOTE — ED Provider Notes (Signed)
[facility]    CSN: [id number] Arrival date & time: 04/08/18  1502     History   Chief Complaint Chief Complaint  Patient presents with  . Nausea    HPI Laura Watson is a 37 y.o. female.   37 year old female presents with nausea, headache, diarrhea and abdominal pain for the past 3 days. Started with watery, loose stools and nausea and now having more upper abdominal pain. Had low grade fever 1st 2 days but has resolved. Stools still watery and greenish-brown. Last BM about 1 hour ago. Has had more than 5 BM's today. No vomiting or blood in stool. Denies any URI symptoms, urinary symptoms or unusual vaginal discharge. She does not recall eating any usual foods. Family has eaten similar foods and not ill. Has tried Peptobismul once and Ibuprofen as needed. No chronic health issues except mood disorder and takes Lexapro daily.   The history is provided by the patient.    History reviewed. No pertinent past medical history.  Patient Active Problem List   Diagnosis Date Noted  . Anxiety disorder 01/16/2018  . Current moderate episode of major depressive disorder without prior episode (HCC) 01/16/2018  . Class 1 obesity due to excess calories without serious comorbidity with body mass index (BMI) of 30.0 to 30.9 in adult 01/16/2018  . Family history of diabetes mellitus 01/16/2018  . Fatigue 01/16/2018  . History of vitamin D deficiency 01/16/2018    Past Surgical History:  Procedure Laterality Date  . APPENDECTOMY  2009  . CESAREAN SECTION     x2    OB History    Gravida  2   Para  2   Term  2   Preterm      AB      Living  2     SAB      TAB      Ectopic      Multiple      Live Births  2            Home Medications    Prior to Admission medications   Medication Sig Start Date End Date Taking? Authorizing Provider  escitalopram (LEXAPRO) 10 MG tablet Take 1 tablet (10 mg total) by mouth daily. Appointment needed 03/03/18  Yes  Lada, Melinda P, MD  levonorgestrel (MIRENA) 20 MCG/24HR IUD 1 Intra Uterine Device by Intrauterine route continuous.   Yes [provider]  acetaminophen (TYLENOL) 325 MG tablet Take 650 mg by mouth as needed.    [provider]  dicyclomine (BENTYL) 20 MG tablet Take 1 tablet (20 mg total) by mouth 2 (two) times daily as needed for spasms. 04/08/18   ,  Berry, NP  ibuprofen (ADVIL,MOTRIN) 200 MG tablet Take 400 mg by mouth as needed.    [provider]  ondansetron (ZOFRAN ODT) 8 MG disintegrating tablet Take 1 tablet (8 mg total) by mouth every 8 (eight) hours as needed for nausea or vomiting. 04/08/18   ,  Berry, NP  Vitamin D, Ergocalciferol, (DRISDOL) 50000 units CAPS capsule Take 1 capsule (50,000 Units total) by mouth every 7 (seven) days. 03/03/18 04/28/18  Lada, Melinda P, MD    Family History Family History  Problem Relation Age of Onset  . Diabetes Mother   . Cancer Mother 50       lymphoma  . Diabetes Father   . Pulmonary fibrosis Father   . Diabetes Sister   . Diabetes Brother   .   Dementia Maternal Grandmother   . Alzheimer's disease Maternal Grandfather     Social History Social History   Tobacco Use  . Smoking status: Never Smoker  . Smokeless tobacco: Never Used  Substance Use Topics  . Alcohol use: Yes    Comment: occ  . Drug use: No     Allergies   Patient has no known allergies.   Review of Systems Review of Systems  Constitutional: Positive for appetite change, fatigue and fever. Negative for activity change and chills.  HENT: Negative for congestion, facial swelling, mouth sores, nosebleeds, postnasal drip, rhinorrhea, sore throat and trouble swallowing.   Eyes: Negative for pain, discharge, redness and itching.  Respiratory: Negative for cough, chest tightness, shortness of breath and wheezing.   Gastrointestinal: Positive for abdominal distention, abdominal pain, diarrhea and nausea. Negative for blood in  stool, constipation and vomiting.  Genitourinary: Negative for decreased urine volume, difficulty urinating, dysuria, flank pain, frequency, genital sores, hematuria, pelvic pain, urgency, vaginal bleeding, vaginal discharge and vaginal pain.  Musculoskeletal: Negative for arthralgias, back pain and myalgias.  Skin: Negative for color change, rash and wound.  Neurological: Positive for headaches. Negative for dizziness, seizures, syncope, weakness and light-headedness.  Hematological: Negative for adenopathy. Does not bruise/bleed easily.  Psychiatric/Behavioral: The patient is nervous/anxious.      Physical Exam Triage Vital Signs ED Triage Vitals  Enc Vitals Group     BP 04/08/18 1521 126/86     Pulse Rate 04/08/18 1521 84     Resp 04/08/18 1521 18     Temp 04/08/18 1521 98.5 F (36.9 C)     Temp Source 04/08/18 1521 Oral     SpO2 04/08/18 1521 99 %     Weight 04/08/18 1523 175 lb (79.4 kg)     Height --      Head Circumference --      Peak Flow --      Pain Score 04/08/18 1523 8     Pain Loc --      Pain Edu? --      Excl. in [place]? --    No data found.  Updated Vital Signs BP 126/86 (BP Location: Left Arm)   Pulse 84   Temp 98.5 F (36.9 C) (Oral)   Resp 18   Wt 175 lb (79.4 kg)   LMP  (LMP Unknown)   SpO2 99%   BMI 30.04 kg/m   Visual Acuity Right Eye Distance:   Left Eye Distance:   Bilateral Distance:    Right Eye Near:   Left Eye Near:    Bilateral Near:     Physical Exam  Constitutional: She is oriented to person, place, and time. Vital signs are normal. She appears well-developed and well-nourished. She is cooperative. She does not appear ill. No distress.  Patient sitting on exam table in no acute distress.   HENT:  Head: Normocephalic and atraumatic.  Right Ear: Hearing and external ear normal.  Left Ear: Hearing and external ear normal.  Nose: Nose normal.  Mouth/Throat: Uvula is midline, oropharynx is clear and moist and mucous membranes are  normal.  Eyes: Conjunctivae and EOM are normal.  Neck: Normal range of motion. Neck supple.  Cardiovascular: Normal rate, regular rhythm and normal heart sounds.  No murmur heard. Pulmonary/Chest: Effort normal and breath sounds normal. No respiratory distress. She has no decreased breath sounds. She has no wheezes. She has no rhonchi. She has no rales.  Abdominal: Soft. Normal appearance. She exhibits no distension,  no fluid wave, no abdominal bruit, no pulsatile midline mass and no mass. Bowel sounds are increased. There is generalized tenderness and tenderness in the periumbilical area. There is no rigidity, no rebound, no guarding and no CVA tenderness.  Musculoskeletal: Normal range of motion.  Lymphadenopathy:    She has no cervical adenopathy.  Neurological: She is alert and oriented to person, place, and time.  Skin: Skin is warm and dry. Capillary refill takes less than 2 seconds. No rash noted. No erythema.  Psychiatric: Her speech is normal and behavior is normal. Judgment and thought content normal. Her mood appears anxious. Cognition and memory are normal.  Vitals reviewed.    UC Treatments / Results  Labs (all labs ordered are listed, but only abnormal results are displayed) Labs Reviewed - No data to display  EKG None  Radiology No results found.  Procedures Procedures (including critical care time)  Medications Ordered in UC Medications - No data to display  Initial Impression / Assessment and Plan / UC Course  I have reviewed the triage vital signs and the nursing notes.  Pertinent labs & imaging results that were available during my care of the patient were reviewed by me and considered in my medical decision making (see chart for details).    Reviewed with patient that she probably has a viral GI illness. Recommend start Zofran every 8 hours as needed for nausea. May use Bentyl 20mg twice a day as needed for stomach cramps. Try to increase fluids and eat a  bland diet- advance as tolerated. Provided collection kit to obtain stool culture. If fever returns, increase in pain, worsening of diarrhea occurs, go to the ER for further evaluation. Otherwise follow-up pending stool culture results.  Final Clinical Impressions(s) / UC Diagnoses   Final diagnoses:  Diarrhea of presumed infectious origin  Nausea  Abdominal cramping     Discharge Instructions     Recommend start Zofran 8mg every 8 hours as needed for nausea. May take Bentyl 20mg every 12 hours as needed for stomach cramps. Try to increase fluids and bland, carbohydrate diet for now. If increase in pain, fever, diarrhea and vomiting occur, go to the ER for further evaluation. Otherwise, follow-up pending stool culture results.     ED Prescriptions    Medication Sig Dispense Auth. Provider   ondansetron (ZOFRAN ODT) 8 MG disintegrating tablet Take 1 tablet (8 mg total) by mouth every 8 (eight) hours as needed for nausea or vomiting. 15 tablet ,  Berry, NP   dicyclomine (BENTYL) 20 MG tablet Take 1 tablet (20 mg total) by mouth 2 (two) times daily as needed for spasms. 20 tablet ,  Berry, NP     Controlled Substance Prescriptions [place] Controlled Substance Registry consulted? Not Applicable   ,  Berry, NP 04/08/18 2226  

## 2018-04-09 ENCOUNTER — Other Ambulatory Visit
Admission: AD | Admit: 2018-04-09 | Discharge: 2018-04-09 | Disposition: A | Discharge status: Home / Self Care | Payer: BLUE CROSS/BLUE SHIELD | Source: Ambulatory Visit | Attending: Family Medicine | Admitting: Family Medicine

## 2018-04-09 DIAGNOSIS — R197 Diarrhea, unspecified: Secondary | ICD-10-CM | POA: Insufficient documentation

## 2018-04-09 DIAGNOSIS — R109 Unspecified abdominal pain: Secondary | ICD-10-CM | POA: Diagnosis not present

## 2018-04-10 LAB — GASTROINTESTINAL PANEL BY PCR, STOOL (REPLACES STOOL CULTURE)

## 2018-04-13 ENCOUNTER — Telehealth: Payer: Self-pay | Admitting: Emergency Medicine

## 2018-04-13 NOTE — Telephone Encounter (Signed)
Patient called in to [facility] for results of her stool culture. Per MD note, + for [employer]. Continue supportive care. Patient notified of results and had no further questions or concerns at this time. Patient does report that she is feeling better.

## 2018-05-06 ENCOUNTER — Other Ambulatory Visit: Payer: Self-pay | Admitting: Family Medicine

## 2018-12-06 ENCOUNTER — Encounter: Payer: Self-pay | Admitting: Advanced Practice Midwife

## 2018-12-06 ENCOUNTER — Other Ambulatory Visit (HOSPITAL_COMMUNITY)
Admission: RE | Admit: 2018-12-06 | Discharge: 2018-12-06 | Disposition: A | Discharge status: Home / Self Care | Payer: BLUE CROSS/BLUE SHIELD | Source: Ambulatory Visit | Attending: Advanced Practice Midwife | Admitting: Advanced Practice Midwife

## 2018-12-06 ENCOUNTER — Ambulatory Visit (INDEPENDENT_AMBULATORY_CARE_PROVIDER_SITE_OTHER): Payer: BLUE CROSS/BLUE SHIELD | Admitting: Advanced Practice Midwife

## 2018-12-06 VITALS — BP 122/78 | Ht 64.0 in | Wt 186.0 lb

## 2018-12-06 DIAGNOSIS — Z124 Encounter for screening for malignant neoplasm of cervix: Secondary | ICD-10-CM

## 2018-12-06 DIAGNOSIS — Z01419 Encounter for gynecological examination (general) (routine) without abnormal findings: Secondary | ICD-10-CM | POA: Insufficient documentation

## 2018-12-06 NOTE — Patient Instructions (Signed)
Health Maintenance, Female Adopting a healthy lifestyle and getting preventive care can go a long way to promote health and wellness. Talk with your health care provider about what schedule of regular examinations is right for you. This is a good chance for you to check in with your provider about disease prevention and staying healthy. In between checkups, there are plenty of things you can do on your own. Experts have done a lot of research about which lifestyle changes and preventive measures are most likely to keep you healthy. Ask your health care provider for more information. Weight and diet Eat a healthy diet  Be sure to include plenty of vegetables, fruits, low-fat dairy products, and lean protein.  Do not eat a lot of foods high in solid fats, added sugars, or salt.  Get regular exercise. This is one of the most important things you can do for your health. ? Most adults should exercise for at least 150 minutes each week. The exercise should increase your heart rate and make you sweat (moderate-intensity exercise). ? Most adults should also do strengthening exercises at least twice a week. This is in addition to the moderate-intensity exercise. Maintain a healthy weight  Body mass index (BMI) is a measurement that can be used to identify possible weight problems. It estimates body fat based on height and weight. Your health care provider can help determine your BMI and help you achieve or maintain a healthy weight.  For females 5 years of age and older: ? A BMI below 18.5 is considered underweight. ? A BMI of 18.5 to 24.9 is normal. ? A BMI of 25 to 29.9 is considered overweight. ? A BMI of 30 and above is considered obese. Watch levels of cholesterol and blood lipids  You should start having your blood tested for lipids and cholesterol at 39 years of age, then have this test every 5 years.  You may need to have your cholesterol levels checked more often if: ? Your lipid or  cholesterol levels are high. ? You are older than 39 years of age. ? You are at high risk for heart disease. Cancer screening Lung Cancer  Lung cancer screening is recommended for adults 48-79 years old who are at high risk for lung cancer because of a history of smoking.  A yearly low-dose CT scan of the lungs is recommended for people who: ? Currently smoke. ? Have quit within the past 15 years. ? Have at least a 30-pack-year history of smoking. A pack year is smoking an average of one pack of cigarettes a day for 1 year.  Yearly screening should continue until it has been 15 years since you quit.  Yearly screening should stop if you develop a health problem that would prevent you from having lung cancer treatment. Breast Cancer  Practice breast self-awareness. This means understanding how your breasts normally appear and feel.  It also means doing regular breast self-exams. Let your health care provider know about any changes, no matter how small.  If you are in your 20s or 30s, you should have a clinical breast exam (CBE) by a health care provider every 1-3 years as part of a regular health exam.  If you are 22 or older, have a CBE every year. Also consider having a breast X-ray (mammogram) every year.  If you have a family history of breast cancer, talk to your health care provider about genetic screening.  If you are at high risk for breast cancer, talk  to your health care provider about having an MRI and a mammogram every year.  Breast cancer gene (BRCA) assessment is recommended for women who have family members with BRCA-related cancers. BRCA-related cancers include: ? Breast. ? Ovarian. ? Tubal. ? Peritoneal cancers.  Results of the assessment will determine the need for genetic counseling and BRCA1 and BRCA2 testing. Cervical Cancer Your health care provider may recommend that you be screened regularly for cancer of the pelvic organs (ovaries, uterus, and vagina).  This screening involves a pelvic examination, including checking for microscopic changes to the surface of your cervix (Pap test). You may be encouraged to have this screening done every 3 years, beginning at age 21.  For women ages 30-65, health care providers may recommend pelvic exams and Pap testing every 3 years, or they may recommend the Pap and pelvic exam, combined with testing for human papilloma virus (HPV), every 5 years. Some types of HPV increase your risk of cervical cancer. Testing for HPV may also be done on women of any age with unclear Pap test results.  Other health care providers may not recommend any screening for nonpregnant women who are considered low risk for pelvic cancer and who do not have symptoms. Ask your health care provider if a screening pelvic exam is right for you.  If you have had past treatment for cervical cancer or a condition that could lead to cancer, you need Pap tests and screening for cancer for at least 20 years after your treatment. If Pap tests have been discontinued, your risk factors (such as having a new sexual partner) need to be reassessed to determine if screening should resume. Some women have medical problems that increase the chance of getting cervical cancer. In these cases, your health care provider may recommend more frequent screening and Pap tests. Colorectal Cancer  This type of cancer can be detected and often prevented.  Routine colorectal cancer screening usually begins at 39 years of age and continues through 39 years of age.  Your health care provider may recommend screening at an earlier age if you have risk factors for colon cancer.  Your health care provider may also recommend using home test kits to check for hidden blood in the stool.  A small camera at the end of a tube can be used to examine your colon directly (sigmoidoscopy or colonoscopy). This is done to check for the earliest forms of colorectal cancer.  Routine  screening usually begins at age 50.  Direct examination of the colon should be repeated every 5-10 years through 39 years of age. However, you may need to be screened more often if early forms of precancerous polyps or small growths are found. Skin Cancer  Check your skin from head to toe regularly.  Tell your health care provider about any new moles or changes in moles, especially if there is a change in a mole's shape or color.  Also tell your health care provider if you have a mole that is larger than the size of a pencil eraser.  Always use sunscreen. Apply sunscreen liberally and repeatedly throughout the day.  Protect yourself by wearing long sleeves, pants, a wide-brimmed hat, and sunglasses whenever you are outside. Heart disease, diabetes, and high blood pressure  High blood pressure causes heart disease and increases the risk of stroke. High blood pressure is more likely to develop in: ? People who have blood pressure in the high end of the normal range (130-139/85-89 mm Hg). ? People   who are overweight or obese. ? People who are African American.  If you are 84-22 years of age, have your blood pressure checked every 3-5 years. If you are 67 years of age or older, have your blood pressure checked every year. You should have your blood pressure measured twice-once when you are at a hospital or clinic, and once when you are not at a hospital or clinic. Record the average of the two measurements. To check your blood pressure when you are not at a hospital or clinic, you can use: ? An automated blood pressure machine at a pharmacy. ? A home blood pressure monitor.  If you are between 52 years and 3 years old, ask your health care provider if you should take aspirin to prevent strokes.  Have regular diabetes screenings. This involves taking a blood sample to check your fasting blood sugar level. ? If you are at a normal weight and have a low risk for diabetes, have this test once  every three years after 39 years of age. ? If you are overweight and have a high risk for diabetes, consider being tested at a younger age or more often. Preventing infection Hepatitis B  If you have a higher risk for hepatitis B, you should be screened for this virus. You are considered at high risk for hepatitis B if: ? You were born in a country where hepatitis B is common. Ask your health care provider which countries are considered high risk. ? Your parents were born in a high-risk country, and you have not been immunized against hepatitis B (hepatitis B vaccine). ? You have HIV or AIDS. ? You use needles to inject street drugs. ? You live with someone who has hepatitis B. ? You have had sex with someone who has hepatitis B. ? You get hemodialysis treatment. ? You take certain medicines for conditions, including cancer, organ transplantation, and autoimmune conditions. Hepatitis C  Blood testing is recommended for: ? Everyone born from [birth year] through 1966. ? Anyone with known risk factors for hepatitis C. Sexually transmitted infections (STIs)  You should be screened for sexually transmitted infections (STIs) including gonorrhea and chlamydia if: ? You are sexually active and are younger than 39 years of age. ? You are older than 39 years of age and your health care provider tells you that you are at risk for this type of infection. ? Your sexual activity has changed since you were last screened and you are at an increased risk for chlamydia or gonorrhea. Ask your health care provider if you are at risk.  If you do not have HIV, but are at risk, it may be recommended that you take a prescription medicine daily to prevent HIV infection. This is called pre-exposure prophylaxis (PrEP). You are considered at risk if: ? You are sexually active and do not regularly use condoms or know the HIV status of your partner(s). ? You take drugs by injection. ? You are sexually active with a partner  who has HIV. Talk with your health care provider about whether you are at high risk of being infected with HIV. If you choose to begin PrEP, you should first be tested for HIV. You should then be tested every 3 months for as long as you are taking PrEP. Pregnancy  If you are premenopausal and you may become pregnant, ask your health care provider about preconception counseling.  If you may become pregnant, take 400 to 800 micrograms (mcg) of folic acid every  day.  If you want to prevent pregnancy, talk to your health care provider about birth control (contraception). Osteoporosis and menopause  Osteoporosis is a disease in which the bones lose minerals and strength with aging. This can result in serious bone fractures. Your risk for osteoporosis can be identified using a bone density scan.  If you are 60 years of age or older, or if you are at risk for osteoporosis and fractures, ask your health care provider if you should be screened.  Ask your health care provider whether you should take a calcium or vitamin D supplement to lower your risk for osteoporosis.  Menopause may have certain physical symptoms and risks.  Hormone replacement therapy may reduce some of these symptoms and risks. Talk to your health care provider about whether hormone replacement therapy is right for you. Follow these instructions at home:  Schedule regular health, dental, and eye exams.  Stay current with your immunizations.  Do not use any tobacco products including cigarettes, chewing tobacco, or electronic cigarettes.  If you are pregnant, do not drink alcohol.  If you are breastfeeding, limit how much and how often you drink alcohol.  Limit alcohol intake to no more than 1 drink per day for nonpregnant women. One drink equals 12 ounces of beer, 5 ounces of wine, or 1 ounces of hard liquor.  Do not use street drugs.  Do not share needles.  Ask your health care provider for help if you need support  or information about quitting drugs.  Tell your health care provider if you often feel depressed.  Tell your health care provider if you have ever been abused or do not feel safe at home. This information is not intended to replace advice given to you by your health care provider. Make sure you discuss any questions you have with your health care provider. Document Released: 04/26/2011 Document Revised: 03/18/2016 Document Reviewed: 07/15/2015 Elsevier Interactive Patient Education  2019 [employer].     Why follow it? Research shows. . Those who follow the Mediterranean diet have a reduced risk of heart disease  . The diet is associated with a reduced incidence of Parkinson's and Alzheimer's diseases . People following the diet may have longer life expectancies and lower rates of chronic diseases  . The Dietary Guidelines for Americans recommends the Mediterranean diet as an eating plan to promote health and prevent disease  What Is the Mediterranean Diet?  . Healthy eating plan based on typical foods and recipes of Mediterranean-style cooking . The diet is primarily a plant based diet; these foods should make up a majority of meals   Starches - Plant based foods should make up a majority of meals - They are an important sources of vitamins, minerals, energy, antioxidants, and fiber - Choose whole grains, foods high in fiber and minimally processed items  - Typical grain sources include wheat, oats, barley, corn, brown rice, bulgar, farro, millet, polenta, couscous  - Various types of beans include chickpeas, lentils, fava beans, black beans, white beans   Fruits  Veggies - Large quantities of antioxidant rich fruits & veggies; 6 or more servings  - Vegetables can be eaten raw or lightly drizzled with oil and cooked  - Vegetables common to the traditional Mediterranean Diet include: artichokes, arugula, beets, broccoli, brussel sprouts, cabbage, carrots, celery, collard greens,  cucumbers, eggplant, kale, leeks, lemons, lettuce, mushrooms, okra, onions, peas, peppers, potatoes, pumpkin, radishes, rutabaga, shallots, spinach, sweet potatoes, turnips, zucchini - Fruits common to the  Mediterranean Diet include: apples, apricots, avocados, cherries, clementines, dates, figs, grapefruits, grapes, melons, nectarines, oranges, peaches, pears, pomegranates, strawberries, tangerines  Fats - Replace butter and margarine with healthy oils, such as olive oil, canola oil, and tahini  - Limit nuts to no more than a handful a day  - Nuts include walnuts, almonds, pecans, pistachios, pine nuts  - Limit or avoid candied, honey roasted or heavily salted nuts - Olives are central to the [employer] - can be eaten whole or used in a variety of dishes   Meats Protein - Limiting red meat: no more than a few times a month - When eating red meat: choose lean cuts and keep the portion to the size of deck of cards - Eggs: approx. 0 to 4 times a week  - Fish and lean poultry: at least 2 a week  - Healthy protein sources include, chicken, [place], lean beef, lamb - Increase intake of seafood such as tuna, salmon, trout, mackerel, shrimp, scallops - Avoid or limit high fat processed meats such as sausage and bacon  Dairy - Include moderate amounts of low fat dairy products  - Focus on healthy dairy such as fat free yogurt, skim milk, low or reduced fat cheese - Limit dairy products higher in fat such as whole or 2% milk, cheese, ice cream  Alcohol - Moderate amounts of red wine is ok  - No more than 5 oz daily for women (all ages) and men older than age 14  - No more than 10 oz of wine daily for men younger than 30  Other - Limit sweets and other desserts  - Use herbs and spices instead of salt to flavor foods  - Herbs and spices common to the traditional Mediterranean Diet include: basil, bay leaves, chives, cloves, cumin, fennel, garlic, lavender, marjoram, mint, oregano, parsley, pepper,  rosemary, sage, savory, sumac, tarragon, thyme   It's not just a diet, it's a lifestyle:  . The Mediterranean diet includes lifestyle factors typical of those in the region  . Foods, drinks and meals are best eaten with others and savored . Daily physical activity is important for overall good health . This could be strenuous exercise like running and aerobics . This could also be more leisurely activities such as walking, housework, yard-work, or taking the stairs . Moderation is the key; a balanced and healthy diet accommodates most foods and drinks . Consider portion sizes and frequency of consumption of certain foods   Meal Ideas & Options:  . Breakfast:  o Whole wheat toast or whole wheat English muffins with peanut butter & hard boiled egg o Steel cut oats topped with apples & cinnamon and skim milk  o Fresh fruit: banana, strawberries, melon, berries, peaches  o Smoothies: strawberries, bananas, greek yogurt, peanut butter o Low fat greek yogurt with blueberries and granola  o Egg white omelet with spinach and mushrooms o Breakfast couscous: whole wheat couscous, apricots, skim milk, cranberries  . Sandwiches:  o Hummus and grilled vegetables (peppers, zucchini, squash) on whole wheat bread   o Grilled chicken on whole wheat pita with lettuce, tomatoes, cucumbers or tzatziki  o Tuna salad on whole wheat bread: tuna salad made with greek yogurt, olives, red peppers, capers, green onions o Garlic rosemary lamb pita: lamb sauted with garlic, rosemary, salt & pepper; add lettuce, cucumber, greek yogurt to pita - flavor with lemon juice and black pepper  . Seafood:  o Mediterranean grilled salmon, seasoned with garlic, basil,  parsley, lemon juice and black pepper o Shrimp, lemon, and spinach whole-grain pasta salad made with low fat greek yogurt  o Seared scallops with lemon orzo  o Seared tuna steaks seasoned salt, pepper, coriander topped with tomato mixture of olives, tomatoes,  olive oil, minced garlic, parsley, green onions and cappers  . Meats:  o Herbed greek chicken salad with kalamata olives, cucumber, feta  o Red bell peppers stuffed with spinach, bulgur, lean ground beef (or lentils) & topped with feta   o Kebabs: skewers of chicken, tomatoes, onions, zucchini, squash  o [place] burgers: made with red onions, mint, dill, lemon juice, feta cheese topped with roasted red peppers . Vegetarian o Cucumber salad: cucumbers, artichoke hearts, celery, red onion, feta cheese, tossed in olive oil & lemon juice  o Hummus and whole grain pita points with a greek salad (lettuce, tomato, feta, olives, cucumbers, red onion) o Lentil soup with celery, carrots made with vegetable broth, garlic, salt and pepper  o Tabouli salad: parsley, bulgur, mint, scallions, cucumbers, tomato, radishes, lemon juice, olive oil, salt and pepper.      American Heart Association (AHA) Exercise Recommendation  Being physically active is important to prevent heart disease and stroke, the nation's No. 1and No. 5killers. To improve overall cardiovascular health, we suggest at least 150 minutes per week of moderate exercise or 75 minutes per week of vigorous exercise (or a combination of moderate and vigorous activity). Thirty minutes a day, five times a week is an easy goal to remember. You will also experience benefits even if you divide your time into two or three segments of 10 to 15 minutes per day.  For people who would benefit from lowering their blood pressure or cholesterol, we recommend 40 minutes of aerobic exercise of moderate to vigorous intensity three to four times a week to lower the risk for heart attack and stroke.  Physical activity is anything that makes you move your body and burn calories.  This includes things like climbing stairs or playing sports. Aerobic exercises benefit your heart, and include walking, jogging, swimming or biking. Strength and stretching exercises are best  for overall stamina and flexibility.  The simplest, positive change you can make to effectively improve your heart health is to start walking. It's enjoyable, free, easy, social and great exercise. A walking program is flexible and boasts high success rates because people can stick with it. It's easy for walking to become a regular and satisfying part of life.   For Overall Cardiovascular Health:  At least 30 minutes of moderate-intensity aerobic activity at least 5 days per week for a total of 150  OR   At least 25 minutes of vigorous aerobic activity at least 3 days per week for a total of 75 minutes; or a combination of moderate- and vigorous-intensity aerobic activity  AND   Moderate- to high-intensity muscle-strengthening activity at least 2 days per week for additional health benefits.  For Lowering Blood Pressure and Cholesterol  An average 40 minutes of moderate- to vigorous-intensity aerobic activity 3 or 4 times per week  What if I can't make it to the time goal? Something is always better than nothing! And everyone has to start somewhere. Even if you've been sedentary for years, today is the day you can begin to make healthy changes in your life. If you don't think you'll make it for 30 or 40 minutes, set a reachable goal for today. You can work up toward your overall goal by  increasing your time as you get stronger. Don't let all-or-nothing thinking rob you of doing what you can every day.  Source:http://www.heart.org
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
## 2018-12-12 ENCOUNTER — Encounter: Payer: BLUE CROSS/BLUE SHIELD | Admitting: Family Medicine

## 2018-12-12 LAB — CYTOLOGY - PAP
Diagnosis: UNDETERMINED — AB
HPV 16/18/45 GENOTYPING: POSITIVE — AB
HPV: DETECTED — AB

## 2019-01-08 ENCOUNTER — Encounter: Payer: Self-pay | Admitting: Obstetrics and Gynecology

## 2019-01-08 ENCOUNTER — Other Ambulatory Visit: Payer: Self-pay

## 2019-01-08 ENCOUNTER — Ambulatory Visit (INDEPENDENT_AMBULATORY_CARE_PROVIDER_SITE_OTHER): Payer: BLUE CROSS/BLUE SHIELD | Admitting: Obstetrics and Gynecology

## 2019-01-08 VITALS — BP 120/72 | HR 83 | Ht 64.0 in | Wt 182.0 lb

## 2019-01-08 DIAGNOSIS — N72 Inflammatory disease of cervix uteri: Secondary | ICD-10-CM

## 2019-01-08 DIAGNOSIS — R87810 Cervical high risk human papillomavirus (HPV) DNA test positive: Secondary | ICD-10-CM | POA: Diagnosis not present

## 2019-01-08 DIAGNOSIS — R87610 Atypical squamous cells of undetermined significance on cytologic smear of cervix (ASC-US): Secondary | ICD-10-CM | POA: Diagnosis not present

## 2019-01-08 DIAGNOSIS — B977 Papillomavirus as the cause of diseases classified elsewhere: Secondary | ICD-10-CM | POA: Diagnosis not present

## 2019-01-08 NOTE — Progress Notes (Signed)
   GYNECOLOGY CLINIC COLPOSCOPY PROCEDURE NOTE  38 y.o. [id number] here for colposcopy for ASCUS with POSITIVE high risk HPV  pap smear on 12/06/2018. Discussed underlying role for HPV infection in the development of cervical dysplasia, its natural history and progression/regression, need for surveillance.  She has not had a pap smear in 7 years  Is the patient  pregnant: No LMP: No LMP recorded. (Menstrual status: IUD). Smoking status:  reports that she has never smoked. She has never used smokeless tobacco. Contraception: IUD  Patient given informed consent, signed copy in the chart, time out was performed.  The patient was position in dorsal lithotomy position. Speculum was placed the cervix was visualized.   After application of acetic acid colposcopic inspection of the cervix was undertaken.   Colposcopy adequate, full visualization of transformation zone: Yes visible lesion(s) at 3 and 3 o'clock; corresponding biopsies obtained.   ECC specimen obtained:  Yes  All specimens were labeled and sent to pathology.   Patient was given post procedure instructions.  Will follow up pathology and manage accordingly.  Routine preventative health maintenance measures emphasized.  Physical Exam Genitourinary:        Adrian Prows MD [facility], [facility] 01/08/2019 10:29 AM

## 2019-01-12 LAB — PATHOLOGY

## 2019-01-15 ENCOUNTER — Telehealth: Payer: Self-pay | Admitting: Obstetrics and Gynecology

## 2019-01-15 NOTE — Telephone Encounter (Signed)
Please advise. Thank you

## 2019-01-15 NOTE — Telephone Encounter (Signed)
Calling for colpo results. Can call on cell or work #.

## 2019-01-16 NOTE — Progress Notes (Signed)
Called and discussed with patient- repeat pap in 1 year

## 2019-01-18 ENCOUNTER — Ambulatory Visit (INDEPENDENT_AMBULATORY_CARE_PROVIDER_SITE_OTHER): Payer: BLUE CROSS/BLUE SHIELD | Admitting: Family Medicine

## 2019-01-18 ENCOUNTER — Other Ambulatory Visit: Payer: Self-pay

## 2019-01-18 ENCOUNTER — Encounter: Payer: Self-pay | Admitting: Family Medicine

## 2019-01-18 VITALS — BP 110/70 | HR 93 | Temp 98.2°F | Resp 14 | Ht 64.0 in | Wt 184.7 lb

## 2019-01-18 DIAGNOSIS — F419 Anxiety disorder, unspecified: Secondary | ICD-10-CM | POA: Diagnosis not present

## 2019-01-18 DIAGNOSIS — Z23 Encounter for immunization: Secondary | ICD-10-CM | POA: Diagnosis not present

## 2019-01-18 DIAGNOSIS — F321 Major depressive disorder, single episode, moderate: Secondary | ICD-10-CM | POA: Diagnosis not present

## 2019-01-18 DIAGNOSIS — H6993 Unspecified Eustachian tube disorder, bilateral: Secondary | ICD-10-CM | POA: Diagnosis not present

## 2019-01-18 DIAGNOSIS — G8929 Other chronic pain: Secondary | ICD-10-CM

## 2019-01-18 DIAGNOSIS — M25571 Pain in right ankle and joints of right foot: Secondary | ICD-10-CM

## 2019-01-18 MED ORDER — FLUTICASONE PROPIONATE 50 MCG/ACT NA SUSP: 2.0000 | Freq: Every day | NASAL | 6 refills | Status: DC

## 2019-01-18 MED ORDER — LEVOCETIRIZINE DIHYDROCHLORIDE 5 MG PO TABS: 5.0000 mg | ORAL_TABLET | Freq: Every evening | ORAL | 0 refills | Status: DC

## 2019-01-18 MED ORDER — BUSPIRONE HCL 7.5 MG PO TABS: ORAL_TABLET | ORAL | 1 refills | Status: DC

## 2019-01-18 MED ORDER — ESCITALOPRAM OXALATE 20 MG PO TABS: 20.0000 mg | ORAL_TABLET | Freq: Every day | ORAL | 1 refills | Status: DC
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
## 2019-01-18 NOTE — Patient Instructions (Signed)
Take 1.5 tablets (15mg ) of your Lexapro 10mg  tablets for 7 days, then increase to 2 tablets (20mg ) until you run out.  Then start taking the 20mg  tablets - 1 tablet once daily.

## 2019-02-16 ENCOUNTER — Other Ambulatory Visit: Payer: Self-pay

## 2019-02-16 ENCOUNTER — Encounter: Payer: Self-pay | Admitting: Family Medicine

## 2019-02-16 ENCOUNTER — Ambulatory Visit (INDEPENDENT_AMBULATORY_CARE_PROVIDER_SITE_OTHER): Payer: BLUE CROSS/BLUE SHIELD | Admitting: Family Medicine

## 2019-02-16 DIAGNOSIS — F321 Major depressive disorder, single episode, moderate: Secondary | ICD-10-CM | POA: Diagnosis not present

## 2019-02-16 DIAGNOSIS — F419 Anxiety disorder, unspecified: Secondary | ICD-10-CM
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
## 2019-05-18 ENCOUNTER — Ambulatory Visit: Payer: BLUE CROSS/BLUE SHIELD | Admitting: Family Medicine

## 2019-05-18 ENCOUNTER — Encounter: Payer: Self-pay | Admitting: Family Medicine

## 2019-05-18 ENCOUNTER — Other Ambulatory Visit: Payer: Self-pay

## 2019-05-18 VITALS — BP 110/76 | HR 98 | Temp 97.8°F | Resp 16 | Ht 64.0 in | Wt 193.4 lb

## 2019-05-18 DIAGNOSIS — G8929 Other chronic pain: Secondary | ICD-10-CM

## 2019-05-18 DIAGNOSIS — N62 Hypertrophy of breast: Secondary | ICD-10-CM

## 2019-05-18 DIAGNOSIS — F419 Anxiety disorder, unspecified: Secondary | ICD-10-CM

## 2019-05-18 DIAGNOSIS — Z1231 Encounter for screening mammogram for malignant neoplasm of breast: Secondary | ICD-10-CM

## 2019-05-18 DIAGNOSIS — N644 Mastodynia: Secondary | ICD-10-CM

## 2019-05-18 DIAGNOSIS — M25519 Pain in unspecified shoulder: Secondary | ICD-10-CM

## 2019-05-18 DIAGNOSIS — F321 Major depressive disorder, single episode, moderate: Secondary | ICD-10-CM | POA: Diagnosis not present

## 2019-05-18 DIAGNOSIS — Z6830 Body mass index (BMI) 30.0-30.9, adult: Secondary | ICD-10-CM

## 2019-05-18 DIAGNOSIS — M542 Cervicalgia: Secondary | ICD-10-CM

## 2019-05-18 DIAGNOSIS — E6609 Other obesity due to excess calories: Secondary | ICD-10-CM

## 2019-05-18 DIAGNOSIS — M25571 Pain in right ankle and joints of right foot: Secondary | ICD-10-CM

## 2019-05-18 DIAGNOSIS — H60393 Other infective otitis externa, bilateral: Secondary | ICD-10-CM

## 2019-05-18 DIAGNOSIS — H6993 Unspecified Eustachian tube disorder, bilateral: Secondary | ICD-10-CM

## 2019-05-18 DIAGNOSIS — R590 Localized enlarged lymph nodes: Secondary | ICD-10-CM

## 2019-05-18 MED ORDER — AZELASTINE HCL 0.1 % NA SOLN: 2.0000 | Freq: Two times a day (BID) | NASAL | 12 refills | Status: DC

## 2019-05-18 MED ORDER — CIPROFLOXACIN-DEXAMETHASONE 0.3-0.1 % OT SUSP: 4.0000 [drp] | Freq: Two times a day (BID) | OTIC | 0 refills | Status: DC

## 2019-05-18 MED ORDER — BUSPIRONE HCL 7.5 MG PO TABS: ORAL_TABLET | ORAL | 1 refills | Status: DC

## 2019-05-18 MED ORDER — ESCITALOPRAM OXALATE 20 MG PO TABS: 20.0000 mg | ORAL_TABLET | Freq: Every day | ORAL | 1 refills | Status: DC
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
## 2019-05-25 ENCOUNTER — Telehealth: Payer: Self-pay

## 2019-05-25 NOTE — Telephone Encounter (Signed)
Copied from [place] [id number]. Topic: General - Other >> May 24, 2019  2:57 PM Celene Kras A wrote: Reason for CRM: Plastic surgery specialist called stating they werent sure if they were supposed to be scheduling pt. Please advise. [phone]  Called and spoke to [facility], informed her that yes the patient is ready to be scheduled.

## 2019-05-28 ENCOUNTER — Other Ambulatory Visit: Payer: Self-pay | Admitting: Unknown Physician Specialty

## 2019-05-28 DIAGNOSIS — R221 Localized swelling, mass and lump, neck: Secondary | ICD-10-CM | POA: Diagnosis not present

## 2019-05-29 ENCOUNTER — Ambulatory Visit
Admission: RE | Admit: 2019-05-29 | Discharge: 2019-05-29 | Disposition: A | Discharge status: Home / Self Care | Payer: BC Managed Care – PPO | Source: Ambulatory Visit | Attending: Unknown Physician Specialty | Admitting: Unknown Physician Specialty

## 2019-05-29 ENCOUNTER — Other Ambulatory Visit: Payer: Self-pay

## 2019-05-29 DIAGNOSIS — R590 Localized enlarged lymph nodes: Secondary | ICD-10-CM | POA: Diagnosis not present

## 2019-05-29 DIAGNOSIS — R221 Localized swelling, mass and lump, neck: Secondary | ICD-10-CM | POA: Diagnosis not present

## 2019-05-31 ENCOUNTER — Other Ambulatory Visit: Payer: Self-pay | Admitting: Unknown Physician Specialty

## 2019-05-31 DIAGNOSIS — R221 Localized swelling, mass and lump, neck: Secondary | ICD-10-CM

## 2019-06-04 ENCOUNTER — Other Ambulatory Visit: Payer: Self-pay | Admitting: Radiology

## 2019-06-05 ENCOUNTER — Other Ambulatory Visit: Payer: Self-pay

## 2019-06-05 ENCOUNTER — Ambulatory Visit
Admission: RE | Admit: 2019-06-05 | Discharge: 2019-06-05 | Disposition: A | Discharge status: Home / Self Care | Payer: BC Managed Care – PPO | Source: Ambulatory Visit | Attending: Unknown Physician Specialty | Admitting: Unknown Physician Specialty

## 2019-06-05 DIAGNOSIS — R221 Localized swelling, mass and lump, neck: Secondary | ICD-10-CM

## 2019-06-05 DIAGNOSIS — R590 Localized enlarged lymph nodes: Secondary | ICD-10-CM | POA: Diagnosis not present

## 2019-06-05 MED ORDER — SODIUM CHLORIDE 0.9 % IV SOLN: INTRAVENOUS | Status: DC

## 2019-06-05 NOTE — Progress Notes (Signed)
Pt stable after right cx node FNA.D/C instructions given.

## 2019-06-06 LAB — CYTOLOGY - NON PAP

## 2019-06-27 ENCOUNTER — Ambulatory Visit
Admission: RE | Admit: 2019-06-27 | Discharge: 2019-06-27 | Disposition: A | Discharge status: Home / Self Care | Payer: BC Managed Care – PPO | Source: Ambulatory Visit | Attending: Family Medicine | Admitting: Family Medicine

## 2019-06-27 DIAGNOSIS — N644 Mastodynia: Secondary | ICD-10-CM | POA: Insufficient documentation

## 2019-06-27 DIAGNOSIS — Z1231 Encounter for screening mammogram for malignant neoplasm of breast: Secondary | ICD-10-CM | POA: Insufficient documentation

## 2019-07-13 ENCOUNTER — Institutional Professional Consult (permissible substitution): Payer: BC Managed Care – PPO | Admitting: Plastic Surgery

## 2019-07-22 ENCOUNTER — Other Ambulatory Visit: Payer: Self-pay | Admitting: Family Medicine

## 2019-07-22 DIAGNOSIS — H6993 Unspecified Eustachian tube disorder, bilateral: Secondary | ICD-10-CM

## 2019-07-23 MED ORDER — LEVOCETIRIZINE DIHYDROCHLORIDE 5 MG PO TABS: 5.0000 mg | ORAL_TABLET | Freq: Every evening | ORAL | 1 refills | Status: DC

## 2019-08-14 ENCOUNTER — Other Ambulatory Visit: Payer: Self-pay

## 2019-08-14 ENCOUNTER — Ambulatory Visit: Payer: BC Managed Care – PPO | Admitting: Plastic Surgery

## 2019-08-14 ENCOUNTER — Encounter: Payer: Self-pay | Admitting: Plastic Surgery

## 2019-08-14 DIAGNOSIS — M542 Cervicalgia: Secondary | ICD-10-CM

## 2019-08-14 DIAGNOSIS — N62 Hypertrophy of breast: Secondary | ICD-10-CM | POA: Diagnosis not present

## 2019-08-14 DIAGNOSIS — M549 Dorsalgia, unspecified: Secondary | ICD-10-CM | POA: Insufficient documentation

## 2019-08-14 DIAGNOSIS — M546 Pain in thoracic spine: Secondary | ICD-10-CM

## 2019-08-14 DIAGNOSIS — G8929 Other chronic pain: Secondary | ICD-10-CM | POA: Diagnosis not present

## 2019-08-14 NOTE — Progress Notes (Addendum)
Patient ID: Laura Watson, female    DOB: October 24, 1981, 38 y.o.   MRN: [id number]   Chief Complaint  Patient presents with  . Breast Problem    Mammary Hyperplasia: The patient is a 38 y.o. female with a history of mammary hyperplasia for several years.  She has extremely large breasts causing symptoms that include the following: Back pain in the upper and lower back, including neck pain. She pulls or pins her bra straps to provide better lift and relief of the pressure and pain. She notices relief by holding her breast up manually.  Her shoulder straps cause grooves and pain and pressure that requires padding for relief. Pain medication is sometimes required with motrin and tylenol.  Activities that are hindered by enlarged breasts include: exercise and running.  She gets rashes in the inframammary area that have to be treated with creams and powder.  Her breasts are extremely large and fairly symmetric but the left is longer than the right.  She has hyperpigmentation of the inframammary area on both sides.  She currently has a yeast rash on the left inframammary fold.  The sternal to nipple distance on the right is 32 cm and the left is 33 cm.  The IMF distance is 16 cm.  She is 5 feet 4 inches tall and weighs 196 pounds.  Preoperative bra size = 36 G and would like to be a B/C cup.  The estimated excess breast tissue to be removed at the time of surgery = 550 grams on the left and 550 grams on the right.  Mammogram history: She had her mammogram this year in [place] and it was negative.    Review of Systems  Constitutional: Positive for activity change. Negative for appetite change.  HENT: Negative.   Eyes: Negative.   Respiratory: Negative.   Cardiovascular: Negative.   Gastrointestinal: Negative for abdominal pain.  Endocrine: Negative.  Negative for cold intolerance.  Genitourinary: Negative.   Musculoskeletal: Positive for back pain and neck pain.  Skin: Positive for rash.  Negative for wound.  Neurological: Negative.   Hematological: Negative.   Psychiatric/Behavioral: Negative.     Past Medical History:  Diagnosis Date  . Anxiety     Past Surgical History:  Procedure Laterality Date  . APPENDECTOMY  2009  . CESAREAN SECTION     x2      Current Outpatient Medications:  .  acetaminophen (TYLENOL) 325 MG tablet, Take 650 mg by mouth as needed., Disp: , Rfl:  .  azelastine (ASTELIN) 0.1 % nasal spray, Place 2 sprays into both nostrils 2 (two) times daily. Use in each nostril as directed, Disp: 30 mL, Rfl: 12 .  busPIRone (BUSPAR) 7.5 MG tablet, Take 1 tablet once daily at night, may also take 1 tablet during the day as needed for anxiety., Disp: 120 tablet, Rfl: 1 .  ciprofloxacin-dexamethasone (CIPRODEX) OTIC suspension, Place 4 drops into both ears 2 (two) times daily. (Patient not taking: Reported on 06/05/2019), Disp: 7.5 mL, Rfl: 0 .  escitalopram (LEXAPRO) 20 MG tablet, Take 1 tablet (20 mg total) by mouth daily. **NOTE DOSE CHANGE**, Disp: 90 tablet, Rfl: 1 .  fluticasone (FLONASE) 50 MCG/ACT nasal spray, Place 2 sprays into both nostrils daily. (Patient not taking: Reported on 06/05/2019), Disp: 16 g, Rfl: 6 .  ibuprofen (ADVIL,MOTRIN) 200 MG tablet, Take 400 mg by mouth as needed., Disp: , Rfl:  .  levocetirizine (XYZAL) 5 MG tablet, Take 1 tablet (5 mg  total) by mouth every evening., Disp: 90 tablet, Rfl: 1 .  levonorgestrel (MIRENA) 20 MCG/24HR IUD, 1 Intra Uterine Device by Intrauterine route continuous., Disp: , Rfl:    Objective:   Vitals:   08/14/19 1019  BP: 121/79  Pulse: 80  Temp: 98.1 F (36.7 C)  SpO2: 98%    Physical Exam Vitals signs and nursing note reviewed.  Constitutional:      Appearance: Normal appearance.  HENT:     Head: Normocephalic and atraumatic.  Eyes:     Extraocular Movements: Extraocular movements intact.  Cardiovascular:     Rate and Rhythm: Normal rate.     Pulses: Normal pulses.  Pulmonary:      Effort: Pulmonary effort is normal. No respiratory distress.  Abdominal:     General: Abdomen is flat. There is no distension.     Tenderness: There is no abdominal tenderness.  Skin:    General: Skin is warm.  Neurological:     General: No focal deficit present.     Mental Status: She is alert and oriented to person, place, and time.  Psychiatric:        Mood and Affect: Mood normal.        Behavior: Behavior normal.        Thought Content: Thought content normal.        Judgment: Judgment normal.     Assessment & Plan:  Chronic bilateral thoracic back pain  Neck pain  Symptomatic mammary hypertrophy  Recommend bilateral breast reduction.  I think the patient would improve greatly from a reduction of the excess breast tissue.  She will get [place] her physical therapy forms as well. Pictures were obtained of the patient and placed in the chart with the patient's or guardian's permission.  Shirley, DO

## 2019-09-11 DIAGNOSIS — R599 Enlarged lymph nodes, unspecified: Secondary | ICD-10-CM | POA: Diagnosis not present

## 2019-09-27 ENCOUNTER — Telehealth: Payer: Self-pay | Admitting: Obstetrics and Gynecology

## 2019-09-27 NOTE — Telephone Encounter (Signed)
FYI

## 2019-09-27 NOTE — Telephone Encounter (Signed)
Patient is schedule for Mirena replacement on 10/08/19 with [facility] at 9 am

## 2019-09-27 NOTE — Telephone Encounter (Signed)
Noted. Will order to arrive by apt date/time. 

## 2019-10-04 ENCOUNTER — Other Ambulatory Visit: Payer: Self-pay | Admitting: Family Medicine

## 2019-10-04 DIAGNOSIS — F419 Anxiety disorder, unspecified: Secondary | ICD-10-CM

## 2019-10-04 DIAGNOSIS — F321 Major depressive disorder, single episode, moderate: Secondary | ICD-10-CM

## 2019-10-04 MED ORDER — BUSPIRONE HCL 7.5 MG PO TABS: ORAL_TABLET | ORAL | 1 refills | Status: DC

## 2019-10-04 NOTE — Telephone Encounter (Signed)
New order sent to [facility]

## 2019-10-04 NOTE — Telephone Encounter (Signed)
Medication Refill - Medication: busPIRone (BUSPAR) 7.5 MG tablet [id number]   Has the patient contacted their pharmacy? No. (Agent: If no, request that the patient contact the pharmacy for the refill.) (Agent: If yes, when and what did the pharmacy advise?)  Preferred Pharmacy (with phone number or street name): [facility] in [place]/ pt has her 6 month fu in [place]: Please be advised that RX refills may take up to 3 business days. We ask that you follow-up with your pharmacy.

## 2019-10-08 ENCOUNTER — Ambulatory Visit: Payer: BLUE CROSS/BLUE SHIELD | Admitting: Obstetrics and Gynecology

## 2019-10-09 NOTE — Telephone Encounter (Signed)
Pt cancelled per Epic. Mirena placed back in stock.

## 2019-10-11 ENCOUNTER — Encounter: Payer: Self-pay | Admitting: Family Medicine

## 2019-10-11 ENCOUNTER — Ambulatory Visit (INDEPENDENT_AMBULATORY_CARE_PROVIDER_SITE_OTHER): Payer: BC Managed Care – PPO | Admitting: Family Medicine

## 2019-10-11 ENCOUNTER — Other Ambulatory Visit: Payer: Self-pay

## 2019-10-11 VITALS — Ht 64.0 in | Wt 196.0 lb

## 2019-10-11 DIAGNOSIS — F321 Major depressive disorder, single episode, moderate: Secondary | ICD-10-CM

## 2019-10-11 DIAGNOSIS — Z6830 Body mass index (BMI) 30.0-30.9, adult: Secondary | ICD-10-CM

## 2019-10-11 DIAGNOSIS — N62 Hypertrophy of breast: Secondary | ICD-10-CM

## 2019-10-11 DIAGNOSIS — G8929 Other chronic pain: Secondary | ICD-10-CM

## 2019-10-11 DIAGNOSIS — F419 Anxiety disorder, unspecified: Secondary | ICD-10-CM

## 2019-10-11 DIAGNOSIS — R221 Localized swelling, mass and lump, neck: Secondary | ICD-10-CM

## 2019-10-11 DIAGNOSIS — M546 Pain in thoracic spine: Secondary | ICD-10-CM

## 2019-10-11 DIAGNOSIS — E6609 Other obesity due to excess calories: Secondary | ICD-10-CM | POA: Diagnosis not present

## 2019-10-11 DIAGNOSIS — M542 Cervicalgia: Secondary | ICD-10-CM

## 2019-10-11 MED ORDER — ESCITALOPRAM OXALATE 20 MG PO TABS: 20.0000 mg | ORAL_TABLET | Freq: Every day | ORAL | 1 refills | Status: DC

## 2019-10-11 MED ORDER — BUSPIRONE HCL 15 MG PO TABS: 15.0000 mg | ORAL_TABLET | Freq: Every day | ORAL | 1 refills | Status: DC

## 2019-10-11 NOTE — Progress Notes (Signed)
Name: Laura Watson   MRN: [id number]    DOB: 13-Oct-1981   Date:10/11/2019       Progress Note  Subjective  Chief Complaint  Chief Complaint  Patient presents with  . Follow-up    6 month follow up  . Medication Refill  . Depression    I connected with  Toni Amend  on 10/11/19 at  8:20 AM EST by a video enabled telemedicine application and verified that I am speaking with the correct person using two identifiers.  I discussed the limitations of evaluation and management by telemedicine and the availability of in person appointments. The patient expressed understanding and agreed to proceed. Staff also discussed with the patient that there may be a patient responsible charge related to this service. Patient Location: Home Provider Location: Office Additional Individuals present: None  HPI  Pt presents for Depression and Anxiety Follow Up:  - At last visit she reportedlack of interest in doing things, having difficulty getting up and going to work and to family functions. No longer missing work.  She has 39yo and 39yo; She also lives with older sister and her 4 children. She has good support with her sister, has some friends who call and check in on her. She has gone to counseling in the past, did find it helpful. - Medication History:Taking Lexapro 20 mg and added buspar 7.5mg  at night.  Sleep is still disturbed - waking up every 2 hours or so despite taking low dose Buspar. We will first try increasing Buspar to 15mg  QHS.  If ineffective, will try trazodone instead. No longer having nightmares.   Obesity: Joined a gym, cannot go right now due do COVID-19, had been going 3 times a week - not currently exercising - education provided. Discussed diet in detail.  Bilateral breast hypertrophy: Pt was able to see Dr. Marla Roe who recommends breast reduction - waiting for 1st of the year and insurance changes.  She asks about breast reduction surgery - she has had very large  breasts since adolescence.  She notes that she has had tension and pain between her shoulders and in her neck for many years.  She notes her breasts are often very uncomfortable, especially with certain bras.  No family history of breast cancer.    Mass of RIGHT side of neck: She had biopsy done by Dr. Tami Ribas and it was benign (lymph node tissue per biopsy); has one follow up in March 2021 and then if things are stable she will just monitor.   Patient Active Problem List   Diagnosis Date Noted  . Back pain 08/14/2019  . Neck pain 08/14/2019  . Symptomatic mammary hypertrophy 08/14/2019  . Anxiety disorder 01/16/2018  . Current moderate episode of major depressive disorder without prior episode ([place]) 01/16/2018  . Class 1 obesity due to excess calories without serious comorbidity with body mass index (BMI) of 30.0 to 30.9 in adult 01/16/2018  . Family history of diabetes mellitus 01/16/2018  . Fatigue 01/16/2018  . History of vitamin D deficiency 01/16/2018    Past Surgical History:  Procedure Laterality Date  . APPENDECTOMY  2009  . CESAREAN SECTION     x2    Family History  Problem Relation Age of Onset  . Diabetes Mother   . Cancer Mother 42       lymphoma  . Diabetes Father   . Pulmonary fibrosis Father   . Diabetes Sister   . Diabetes Brother   . Dementia  Maternal Grandmother   . Alzheimer's disease Maternal Grandfather   . Breast cancer Neg Hx     Social History   Socioeconomic History  . Marital status: Divorced    Spouse name: Not on file  . Number of children: 2  . Years of education: Not on file  . Highest education level: Some college, no degree  Occupational History  . Not on file  Tobacco Use  . Smoking status: Never Smoker  . Smokeless tobacco: Never Used  Substance and Sexual Activity  . Alcohol use: Yes    Comment: occ  . Drug use: No  . Sexual activity: Not Currently    Partners: Male    Birth control/protection: I.U.D.  Other Topics Concern    . 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   Married,    Social Determinants of Health   Financial Resource Strain:   . Difficulty of Paying Living Expenses: Not on file  Food Insecurity:   . Worried About Charity fundraiser in the Last Year: Not on file  . Ran Out of Food in the Last Year: Not on file  Transportation Needs:   . Lack of Transportation (Medical): Not on file  . Lack of Transportation (Non-Medical): Not on file  Physical Activity:   . Days of Exercise per Week: Not on file  . Minutes of Exercise per Session: Not on file  Stress:   . Feeling of Stress : Not on file  Social Connections:   . Frequency of Communication with Friends and Family: Not on file  . Frequency of Social Gatherings with Friends and Family: Not on file  . Attends Religious Services: Not on file  . Active Member of Clubs or Organizations: Not on file  . Attends Archivist Meetings: Not on file  . Marital Status: Not on file  Intimate Partner Violence:   . Fear of Current or Ex-Partner: Not on file  . Emotionally Abused: Not on file  . Physically Abused: Not on file  . Sexually Abused: Not on file     Current Outpatient Medications:  .  acetaminophen (TYLENOL) 325 MG tablet, Take 650 mg by mouth as needed., Disp: , Rfl:  .  azelastine (ASTELIN) 0.1 % nasal spray, Place 2 sprays into both nostrils 2 (two) times daily. Use in each nostril as directed, Disp: 30 mL, Rfl: 12 .  busPIRone (BUSPAR) 7.5 MG tablet, Take 1 tablet once daily at night, may also take 1 tablet during the day as needed for anxiety., Disp: 120 tablet, Rfl: 1 .  escitalopram (LEXAPRO) 20 MG tablet, Take 1 tablet (20 mg total) by mouth daily. **NOTE DOSE CHANGE**, Disp: 90 tablet, Rfl: 1 .  ibuprofen (ADVIL,MOTRIN) 200 MG tablet, Take 400 mg by mouth as needed., Disp: , Rfl:  .  levocetirizine (XYZAL) 5 MG tablet, Take 1 tablet (5 mg total) by mouth  every evening., Disp: 90 tablet, Rfl: 1 .  levonorgestrel (MIRENA) 20 MCG/24HR IUD, 1 Intra Uterine Device by Intrauterine route continuous., Disp: , Rfl:  .  ciprofloxacin-dexamethasone (CIPRODEX) OTIC suspension, Place 4 drops into both ears 2 (two) times daily. (Patient not taking: Reported on 06/05/2019), Disp: 7.5 mL, Rfl: 0 .  fluticasone (FLONASE) 50 MCG/ACT nasal spray, Place 2 sprays into both nostrils daily. (Patient not taking: Reported on 06/05/2019), Disp: 16 g, Rfl: 6  No Known Allergies  I personally reviewed active problem list, medication  list, allergies, notes from last encounter, lab results with the patient/caregiver today.   ROS  Constitutional: Negative for fever or weight change.  Respiratory: Negative for cough and shortness of breath.   Cardiovascular: Negative for chest pain or palpitations.  Gastrointestinal: Negative for abdominal pain, no bowel changes.  Musculoskeletal: Negative for gait problem or joint swelling.  Skin: Negative for rash.  Neurological: Negative for dizziness or headache.  No other specific complaints in a complete review of systems (except as listed in HPI above).  Objective  Virtual encounter, vitals not obtained.  Body mass index is 33.64 kg/m.  Physical Exam  Constitutional: Patient appears well-developed and well-nourished. No distress.  HENT: Head: Normocephalic and atraumatic.  Neck: Normal range of motion. Pulmonary/Chest: Effort normal. No respiratory distress. Speaking in complete sentences Neurological: Pt is alert and oriented to person, place, and time. Coordination, speech and gait are normal.  Psychiatric: Patient has a normal mood and affect. behavior is normal. Judgment and thought content normal.  No results found for this or any previous visit (from the past 72 hour(s)).  PHQ2/9: Depression screen [facility] 2/9 10/11/2019 05/18/2019 02/16/2019 01/18/2019 03/03/2018  Decreased Interest 0 0 0 1 0  Down, Depressed, Hopeless  0 0 0 1 0  PHQ - 2 Score 0 0 0 2 0  Altered sleeping 3 1 2 2  -  Tired, decreased energy 1 1 1 2  -  Change in appetite 0 0 0 3 -  Feeling bad or failure about yourself  0 0 0 0 -  Trouble concentrating 3 2 1 3  -  Moving slowly or fidgety/restless 0 1 0 0 -  Suicidal thoughts 0 0 0 0 -  PHQ-9 Score 7 5 4 12  -  Difficult doing work/chores Somewhat difficult Somewhat difficult Somewhat difficult Somewhat difficult -   PHQ-2/9 Result is positive.    Fall Risk: Fall Risk  10/11/2019 05/18/2019 02/16/2019 01/18/2019 03/03/2018  Falls in the past year? 0 0 0 0 No  Number falls in past yr: 0 0 0 0 -  Injury with Fall? 0 0 0 0 -  Follow up - Falls evaluation completed Falls evaluation completed Falls evaluation completed -    Assessment & Plan  1. Anxiety disorder, unspecified type - Sleep quality is still not adequate - increase buspar, however if not effective after 3-4 weeks, will consider switching to trazodone.  - busPIRone (BUSPAR) 15 MG tablet; Take 1 tablet (15 mg total) by mouth at bedtime.  Dispense: 90 tablet; Refill: 1 - escitalopram (LEXAPRO) 20 MG tablet; Take 1 tablet (20 mg total) by mouth daily.  Dispense: 90 tablet; Refill: 1  2. Current moderate episode of major depressive disorder without prior episode ([facility]) - busPIRone (BUSPAR) 15 MG tablet; Take 1 tablet (15 mg total) by mouth at bedtime.  Dispense: 90 tablet; Refill: 1 - escitalopram (LEXAPRO) 20 MG tablet; Take 1 tablet (20 mg total) by mouth daily.  Dispense: 90 tablet; Refill: 1  3. Class 1 obesity due to excess calories without serious comorbidity with body mass index (BMI) of 30.0 to 30.9 in adult - Discussed importance of 150 minutes of physical activity weekly, eat two servings of fish weekly, eat one serving of tree nuts ( cashews, pistachios, pecans, almonds.Marland Kitchen) every other day, eat 6 servings of fruit/vegetables daily and drink plenty of water and avoid sweet beverages.   4. Symptomatic mammary hypertrophy - She  is hoping to have breast reduction in early 21.  5. Neck pain -  Secondary to breast hypertrophy; ibuprofen taken sparingly.  6. Chronic bilateral thoracic back pain - Secondary to breast hypertrophy; ibuprofen taken sparingly.  7. Mass of right side of neck - Following up with Dr. Tami Ribas with ENT.   I discussed the assessment and treatment plan with the patient. The patient was provided an opportunity to ask questions and all were answered. The patient agreed with the plan and demonstrated an understanding of the instructions.  The patient was advised to call back or seek an in-person evaluation if the symptoms worsen or if the condition fails to improve as anticipated.  I provided 21 minutes of non-face-to-face time during this encounter.

## 2019-11-19 ENCOUNTER — Ambulatory Visit: Payer: BC Managed Care – PPO | Admitting: Family Medicine

## 2019-11-22 ENCOUNTER — Ambulatory Visit: Payer: BC Managed Care – PPO | Attending: Internal Medicine

## 2019-11-22 DIAGNOSIS — Z20822 Contact with and (suspected) exposure to covid-19: Secondary | ICD-10-CM

## 2019-11-23 LAB — NOVEL CORONAVIRUS, NAA: SARS-CoV-2, NAA: NOT DETECTED

## 2019-11-27 ENCOUNTER — Ambulatory Visit: Payer: BC Managed Care – PPO | Attending: Internal Medicine

## 2019-11-27 DIAGNOSIS — Z20822 Contact with and (suspected) exposure to covid-19: Secondary | ICD-10-CM | POA: Diagnosis not present

## 2019-11-28 LAB — NOVEL CORONAVIRUS, NAA: SARS-CoV-2, NAA: NOT DETECTED

## 2019-12-24 DIAGNOSIS — R599 Enlarged lymph nodes, unspecified: Secondary | ICD-10-CM | POA: Diagnosis not present

## 2019-12-24 HISTORY — PX: REDUCTION MAMMAPLASTY: SUR839

## 2019-12-31 ENCOUNTER — Telehealth: Payer: Self-pay

## 2019-12-31 NOTE — Progress Notes (Signed)
ICD-10-CM   1. Symptomatic mammary hypertrophy  N62   2. Neck pain  M54.2   3. Chronic bilateral thoracic back pain  M54.6    G89.29       Patient ID: Laura Watson, female    DOB: Mar 02, 1981, 39 y.o.   MRN: [id number]   History of Present Illness: Laura Watson is a 39 y.o.  female  with a history of mammary hyperplasia.  She presents for preoperative evaluation for upcoming procedure, bilateral breast reduction, scheduled for 01/16/2020 with Dr. Marla Roe.  Summary from previous visit: Her breasts are large and fairly symmetric but the left is longer than the right.  Sternal to nipple distance on the right is 32 cm and the left is 33 cm.  The IMF distance is 16 cm.  She is 5 feet 4 inches tall and weights 200 lbs.  Preoperative bra size = 36 G.  Estimated excess breast tissue to be removed at the time of surgery = 550 g on the left and 550 g on the right.  Job: Photographer  PMH Significant for: Anxiety, appendectomy, and two c-sections.  The patient has not had problems with anesthesia.   Past Medical History: Allergies: No Known Allergies  Current Medications:  Current Outpatient Medications:  .  acetaminophen (TYLENOL) 325 MG tablet, Take 650 mg by mouth as needed., Disp: , Rfl:  .  azelastine (ASTELIN) 0.1 % nasal spray, Place 2 sprays into both nostrils 2 (two) times daily. Use in each nostril as directed, Disp: 30 mL, Rfl: 12 .  busPIRone (BUSPAR) 15 MG tablet, Take 1 tablet (15 mg total) by mouth at bedtime., Disp: 90 tablet, Rfl: 1 .  escitalopram (LEXAPRO) 20 MG tablet, Take 1 tablet (20 mg total) by mouth daily., Disp: 90 tablet, Rfl: 1 .  fluticasone (FLONASE) 50 MCG/ACT nasal spray, Place 2 sprays into both nostrils daily., Disp: 16 g, Rfl: 6 .  ibuprofen (ADVIL,MOTRIN) 200 MG tablet, Take 400 mg by mouth as needed., Disp: , Rfl:  .  levocetirizine (XYZAL) 5 MG tablet, Take 1 tablet (5 mg total) by mouth every evening., Disp: 90 tablet, Rfl: 1 .   levonorgestrel (MIRENA) 20 MCG/24HR IUD, 1 Intra Uterine Device by Intrauterine route continuous., Disp: , Rfl:   Past Medical Problems: Past Medical History:  Diagnosis Date  . Anxiety     Past Surgical History: Past Surgical History:  Procedure Laterality Date  . APPENDECTOMY  2009  . CESAREAN SECTION     x2    Social History: Social History   Socioeconomic History  . Marital status: Divorced    Spouse name: Not on file  . Number of children: 2  . Years of education: Not on file  . Highest education level: Some college, no degree  Occupational History  . Not on file  Tobacco Use  . Smoking status: Never Smoker  . Smokeless tobacco: Never Used  Substance and Sexual Activity  . Alcohol use: Yes    Comment: occ  . Drug use: No  . Sexual activity: Not Currently    Partners: Male    Birth control/protection: I.U.D.  Other Topics Concern  . 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   Married,    Social Determinants of Health   Financial Resource Strain:   . Difficulty of Paying Living Expenses: Not on file  Food Insecurity:   . Worried  About Running Out of Food in the Last Year: Not on file  . Ran Out of Food in the Last Year: Not on file  Transportation Needs:   . Lack of Transportation (Medical): Not on file  . Lack of Transportation (Non-Medical): Not on file  Physical Activity:   . Days of Exercise per Week: Not on file  . Minutes of Exercise per Session: Not on file  Stress:   . Feeling of Stress : Not on file  Social Connections:   . Frequency of Communication with Friends and Family: Not on file  . Frequency of Social Gatherings with Friends and Family: Not on file  . Attends Religious Services: Not on file  . Active Member of Clubs or Organizations: Not on file  . Attends Archivist Meetings: Not on file  . Marital Status: Not on file  Intimate Partner Violence:   . Fear of  Current or Ex-Partner: Not on file  . Emotionally Abused: Not on file  . Physically Abused: Not on file  . Sexually Abused: Not on file    Family History: Family History  Problem Relation Age of Onset  . Diabetes Mother   . Cancer Mother 48       lymphoma  . Diabetes Father   . Pulmonary fibrosis Father   . Diabetes Sister   . Diabetes Brother   . Dementia Maternal Grandmother   . Alzheimer's disease Maternal Grandfather   . Breast cancer Neg Hx     Review of Systems: Review of Systems  Constitutional: Negative for chills and fever.  HENT: Negative for congestion and sore throat.   Respiratory: Negative for cough and shortness of breath.   Cardiovascular: Negative for chest pain and palpitations.  Gastrointestinal: Negative for abdominal pain, nausea and vomiting.  Musculoskeletal: Positive for back pain and neck pain. Negative for joint pain and myalgias.  Skin: Negative for itching and rash (no current rash, but often gets yeast rashes in IMFs).    Physical Exam: Vital Signs BP 118/72 (BP Location: Left Arm, Patient Position: Sitting, Cuff Size: Large)   Pulse 74   Temp 98.4 F (36.9 C) (Temporal)   Ht 5\' 4"  (1.626 m)   Wt 200 lb 3.2 oz (90.8 kg)   SpO2 98%   BMI 34.36 kg/m  Physical Exam Constitutional:      Appearance: Normal appearance. She is normal weight.  HENT:     Head: Normocephalic and atraumatic.  Eyes:     Extraocular Movements: Extraocular movements intact.  Cardiovascular:     Rate and Rhythm: Normal rate and regular rhythm.     Pulses: Normal pulses.     Heart sounds: Normal heart sounds.  Pulmonary:     Effort: Pulmonary effort is normal.     Breath sounds: Normal breath sounds. No wheezing, rhonchi or rales.  Abdominal:     General: Bowel sounds are normal.     Palpations: Abdomen is soft.  Musculoskeletal:        General: No swelling. Normal range of motion.     Cervical back: Normal range of motion.  Skin:    General: Skin is warm  and dry.     Coloration: Skin is not jaundiced or pale.     Findings: No erythema, lesion or rash.  Neurological:     General: No focal deficit present.     Mental Status: She is alert and oriented to person, place, and time.  Psychiatric:  Mood and Affect: Mood normal.        Behavior: Behavior normal.        Thought Content: Thought content normal.        Judgment: Judgment normal.     Assessment/Plan:  Ms. Richardson Landry scheduled for bilateral breast reduction with Dr. Marla Roe.  Risks, benefits, and alternatives of procedure discussed, questions answered and consent obtained.    Smoking Status: Non-smoker Last Mammogram: 06/28/2019; Results: No findings suspicious for malignancy  Caprini Score: 3 Moderate; Risk Factors include: 39 year old female, BMI > 25, and length of planned surgery. Recommendation for mechanical or pharmacological prophylaxis during surgery. Encourage early ambulation.   Pictures obtained: 08/14/2019  Post-op Rx sent to pharmacy: Norco, Keflex, Zofran  The 21st Century Cures Act was signed into law in 2016 which includes the topic of electronic health records.  This provides immediate access to information in MyChart.  This includes consultation notes, operative notes, office notes, lab results and pathology reports.  If you have any questions about what you read please let us know at your next visit or call us at the office.  We are right here with you.   Electronically signed by: Threasa Heads, PA-C 01/01/2020 9:53 AM

## 2019-12-31 NOTE — Telephone Encounter (Signed)

## 2019-12-31 NOTE — H&P (View-Only) (Signed)
ICD-10-CM   1. Symptomatic mammary hypertrophy  N62   2. Neck pain  M54.2   3. Chronic bilateral thoracic back pain  M54.6    G89.29       Patient ID: Laura Watson, female    DOB: 1981/04/10, 39 y.o.   MRN: [id number]   History of Present Illness: Laura Watson is a 39 y.o.  female  with a history of mammary hyperplasia.  She presents for preoperative evaluation for upcoming procedure, bilateral breast reduction, scheduled for 01/16/2020 with Dr. Marla Roe.  Summary from previous visit: Her breasts are large and fairly symmetric but the left is longer than the right.  Sternal to nipple distance on the right is 32 cm and the left is 33 cm.  The IMF distance is 16 cm.  She is 5 feet 4 inches tall and weights 200 lbs.  Preoperative bra size = 36 G.  Estimated excess breast tissue to be removed at the time of surgery = 550 g on the left and 550 g on the right.  Job: Photographer  PMH Significant for: Anxiety, appendectomy, and two c-sections.  The patient has not had problems with anesthesia.   Past Medical History: Allergies: No Known Allergies  Current Medications:  Current Outpatient Medications:  .  acetaminophen (TYLENOL) 325 MG tablet, Take 650 mg by mouth as needed., Disp: , Rfl:  .  azelastine (ASTELIN) 0.1 % nasal spray, Place 2 sprays into both nostrils 2 (two) times daily. Use in each nostril as directed, Disp: 30 mL, Rfl: 12 .  busPIRone (BUSPAR) 15 MG tablet, Take 1 tablet (15 mg total) by mouth at bedtime., Disp: 90 tablet, Rfl: 1 .  escitalopram (LEXAPRO) 20 MG tablet, Take 1 tablet (20 mg total) by mouth daily., Disp: 90 tablet, Rfl: 1 .  fluticasone (FLONASE) 50 MCG/ACT nasal spray, Place 2 sprays into both nostrils daily., Disp: 16 g, Rfl: 6 .  ibuprofen (ADVIL,MOTRIN) 200 MG tablet, Take 400 mg by mouth as needed., Disp: , Rfl:  .  levocetirizine (XYZAL) 5 MG tablet, Take 1 tablet (5 mg total) by mouth every evening., Disp: 90 tablet, Rfl: 1 .   levonorgestrel (MIRENA) 20 MCG/24HR IUD, 1 Intra Uterine Device by Intrauterine route continuous., Disp: , Rfl:   Past Medical Problems: Past Medical History:  Diagnosis Date  . Anxiety     Past Surgical History: Past Surgical History:  Procedure Laterality Date  . APPENDECTOMY  2009  . CESAREAN SECTION     x2    Social History: Social History   Socioeconomic History  . Marital status: Divorced    Spouse name: Not on file  . Number of children: 2  . Years of education: Not on file  . Highest education level: Some college, no degree  Occupational History  . Not on file  Tobacco Use  . Smoking status: Never Smoker  . Smokeless tobacco: Never Used  Substance and Sexual Activity  . Alcohol use: Yes    Comment: occ  . Drug use: No  . Sexual activity: Not Currently    Partners: Male    Birth control/protection: I.U.D.  Other Topics Concern  . 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   Married,    Social Determinants of Health   Financial Resource Strain:   . Difficulty of Paying Living Expenses: Not on file  Food Insecurity:   . Worried  About Running Out of Food in the Last Year: Not on file  . Ran Out of Food in the Last Year: Not on file  Transportation Needs:   . Lack of Transportation (Medical): Not on file  . Lack of Transportation (Non-Medical): Not on file  Physical Activity:   . Days of Exercise per Week: Not on file  . Minutes of Exercise per Session: Not on file  Stress:   . Feeling of Stress : Not on file  Social Connections:   . Frequency of Communication with Friends and Family: Not on file  . Frequency of Social Gatherings with Friends and Family: Not on file  . Attends Religious Services: Not on file  . Active Member of Clubs or Organizations: Not on file  . Attends Archivist Meetings: Not on file  . Marital Status: Not on file  Intimate Partner Violence:   . Fear of  Current or Ex-Partner: Not on file  . Emotionally Abused: Not on file  . Physically Abused: Not on file  . Sexually Abused: Not on file    Family History: Family History  Problem Relation Age of Onset  . Diabetes Mother   . Cancer Mother 39       lymphoma  . Diabetes Father   . Pulmonary fibrosis Father   . Diabetes Sister   . Diabetes Brother   . Dementia Maternal Grandmother   . Alzheimer's disease Maternal Grandfather   . Breast cancer Neg Hx     Review of Systems: Review of Systems  Constitutional: Negative for chills and fever.  HENT: Negative for congestion and sore throat.   Respiratory: Negative for cough and shortness of breath.   Cardiovascular: Negative for chest pain and palpitations.  Gastrointestinal: Negative for abdominal pain, nausea and vomiting.  Musculoskeletal: Positive for back pain and neck pain. Negative for joint pain and myalgias.  Skin: Negative for itching and rash (no current rash, but often gets yeast rashes in IMFs).    Physical Exam: Vital Signs BP 118/72 (BP Location: Left Arm, Patient Position: Sitting, Cuff Size: Large)   Pulse 74   Temp 98.4 F (36.9 C) (Temporal)   Ht 5\' 4"  (1.626 m)   Wt 200 lb 3.2 oz (90.8 kg)   SpO2 98%   BMI 34.36 kg/m  Physical Exam Constitutional:      Appearance: Normal appearance. She is normal weight.  HENT:     Head: Normocephalic and atraumatic.  Eyes:     Extraocular Movements: Extraocular movements intact.  Cardiovascular:     Rate and Rhythm: Normal rate and regular rhythm.     Pulses: Normal pulses.     Heart sounds: Normal heart sounds.  Pulmonary:     Effort: Pulmonary effort is normal.     Breath sounds: Normal breath sounds. No wheezing, rhonchi or rales.  Abdominal:     General: Bowel sounds are normal.     Palpations: Abdomen is soft.  Musculoskeletal:        General: No swelling. Normal range of motion.     Cervical back: Normal range of motion.  Skin:    General: Skin is warm  and dry.     Coloration: Skin is not jaundiced or pale.     Findings: No erythema, lesion or rash.  Neurological:     General: No focal deficit present.     Mental Status: She is alert and oriented to person, place, and time.  Psychiatric:  Mood and Affect: Mood normal.        Behavior: Behavior normal.        Thought Content: Thought content normal.        Judgment: Judgment normal.     Assessment/Plan:  Ms. Richardson Landry scheduled for bilateral breast reduction with Dr. Marla Roe.  Risks, benefits, and alternatives of procedure discussed, questions answered and consent obtained.    Smoking Status: Non-smoker Last Mammogram: 06/28/2019; Results: No findings suspicious for malignancy  Caprini Score: 3 Moderate; Risk Factors include: 39 year old female, BMI > 25, and length of planned surgery. Recommendation for mechanical or pharmacological prophylaxis during surgery. Encourage early ambulation.   Pictures obtained: 08/14/2019  Post-op Rx sent to pharmacy: Norco, Keflex, Zofran  The 21st Century Cures Act was signed into law in 2016 which includes the topic of electronic health records.  This provides immediate access to information in MyChart.  This includes consultation notes, operative notes, office notes, lab results and pathology reports.  If you have any questions about what you read please let us know at your next visit or call us at the office.  We are right here with you.   Electronically signed by: Threasa Heads, PA-C 01/01/2020 9:53 AM

## 2020-01-01 ENCOUNTER — Ambulatory Visit (INDEPENDENT_AMBULATORY_CARE_PROVIDER_SITE_OTHER): Payer: BC Managed Care – PPO | Admitting: Plastic Surgery

## 2020-01-01 ENCOUNTER — Other Ambulatory Visit: Payer: Self-pay

## 2020-01-01 ENCOUNTER — Encounter: Payer: Self-pay | Admitting: Plastic Surgery

## 2020-01-01 VITALS — BP 118/72 | HR 74 | Temp 98.4°F | Ht 64.0 in | Wt 200.2 lb

## 2020-01-01 DIAGNOSIS — M546 Pain in thoracic spine: Secondary | ICD-10-CM

## 2020-01-01 DIAGNOSIS — M542 Cervicalgia: Secondary | ICD-10-CM

## 2020-01-01 DIAGNOSIS — N62 Hypertrophy of breast: Secondary | ICD-10-CM

## 2020-01-01 DIAGNOSIS — G8929 Other chronic pain: Secondary | ICD-10-CM

## 2020-01-01 MED ORDER — HYDROCODONE-ACETAMINOPHEN 5-325 MG PO TABS: 1.0000 | ORAL_TABLET | Freq: Three times a day (TID) | ORAL | 0 refills | Status: AC | PRN

## 2020-01-01 MED ORDER — CEPHALEXIN 500 MG PO CAPS: 500.0000 mg | ORAL_CAPSULE | Freq: Four times a day (QID) | ORAL | 0 refills | Status: AC

## 2020-01-01 MED ORDER — ONDANSETRON HCL 4 MG PO TABS: 4.0000 mg | ORAL_TABLET | Freq: Three times a day (TID) | ORAL | 0 refills | Status: DC | PRN

## 2020-01-09 ENCOUNTER — Encounter (HOSPITAL_BASED_OUTPATIENT_CLINIC_OR_DEPARTMENT_OTHER): Payer: Self-pay | Admitting: Plastic Surgery

## 2020-01-09 ENCOUNTER — Other Ambulatory Visit: Payer: Self-pay

## 2020-01-12 ENCOUNTER — Other Ambulatory Visit (HOSPITAL_COMMUNITY)
Admission: RE | Admit: 2020-01-12 | Discharge: 2020-01-12 | Disposition: A | Discharge status: Home / Self Care | Payer: BC Managed Care – PPO | Source: Ambulatory Visit | Attending: Plastic Surgery | Admitting: Plastic Surgery

## 2020-01-12 DIAGNOSIS — Z20822 Contact with and (suspected) exposure to covid-19: Secondary | ICD-10-CM | POA: Insufficient documentation

## 2020-01-12 DIAGNOSIS — Z01812 Encounter for preprocedural laboratory examination: Secondary | ICD-10-CM | POA: Diagnosis not present

## 2020-01-12 LAB — SARS CORONAVIRUS 2 (TAT 6-24 HRS): SARS Coronavirus 2: NEGATIVE

## 2020-01-16 ENCOUNTER — Other Ambulatory Visit: Payer: Self-pay

## 2020-01-16 ENCOUNTER — Ambulatory Visit (HOSPITAL_BASED_OUTPATIENT_CLINIC_OR_DEPARTMENT_OTHER)
Admission: RE | Admit: 2020-01-16 | Discharge: 2020-01-16 | Disposition: A | Discharge status: Home / Self Care | Payer: BC Managed Care – PPO | Attending: Plastic Surgery | Admitting: Plastic Surgery

## 2020-01-16 ENCOUNTER — Encounter (HOSPITAL_BASED_OUTPATIENT_CLINIC_OR_DEPARTMENT_OTHER): Payer: Self-pay | Admitting: Plastic Surgery

## 2020-01-16 ENCOUNTER — Ambulatory Visit (HOSPITAL_BASED_OUTPATIENT_CLINIC_OR_DEPARTMENT_OTHER): Payer: BC Managed Care – PPO | Admitting: Anesthesiology

## 2020-01-16 ENCOUNTER — Encounter (HOSPITAL_BASED_OUTPATIENT_CLINIC_OR_DEPARTMENT_OTHER)
Admission: RE | Disposition: A | Discharge status: Home / Self Care | Payer: Self-pay | Source: Home / Self Care | Attending: Plastic Surgery

## 2020-01-16 DIAGNOSIS — M546 Pain in thoracic spine: Secondary | ICD-10-CM

## 2020-01-16 DIAGNOSIS — G8929 Other chronic pain: Secondary | ICD-10-CM

## 2020-01-16 DIAGNOSIS — N6012 Diffuse cystic mastopathy of left breast: Secondary | ICD-10-CM | POA: Diagnosis not present

## 2020-01-16 DIAGNOSIS — M549 Dorsalgia, unspecified: Secondary | ICD-10-CM | POA: Diagnosis not present

## 2020-01-16 DIAGNOSIS — N62 Hypertrophy of breast: Secondary | ICD-10-CM | POA: Insufficient documentation

## 2020-01-16 DIAGNOSIS — M542 Cervicalgia: Secondary | ICD-10-CM | POA: Diagnosis not present

## 2020-01-16 DIAGNOSIS — F419 Anxiety disorder, unspecified: Secondary | ICD-10-CM | POA: Diagnosis not present

## 2020-01-16 DIAGNOSIS — N6011 Diffuse cystic mastopathy of right breast: Secondary | ICD-10-CM | POA: Diagnosis not present

## 2020-01-16 HISTORY — PX: BREAST REDUCTION SURGERY: SHX8

## 2020-01-16 HISTORY — DX: Other specified postprocedural states: Z98.890

## 2020-01-16 HISTORY — DX: Nausea with vomiting, unspecified: R11.2

## 2020-01-16 HISTORY — DX: Other specified postprocedural states: R11.2

## 2020-01-16 LAB — POCT PREGNANCY, URINE: Preg Test, Ur: NEGATIVE

## 2020-01-16 SURGERY — MAMMOPLASTY, REDUCTION
Anesthesia: General | Site: Breast | Laterality: Bilateral

## 2020-01-16 MED ORDER — MEPERIDINE HCL 25 MG/ML IJ SOLN: 6.2500 mg | INTRAMUSCULAR | Status: DC | PRN

## 2020-01-16 MED ORDER — SUCCINYLCHOLINE CHLORIDE 200 MG/10ML IV SOSY
PREFILLED_SYRINGE | INTRAVENOUS | Status: AC
  Filled 2020-01-16: qty 10

## 2020-01-16 MED ORDER — SUFENTANIL CITRATE 50 MCG/ML IV SOLN
INTRAVENOUS | Status: AC
  Filled 2020-01-16: qty 1

## 2020-01-16 MED ORDER — OXYCODONE HCL 5 MG/5ML PO SOLN: 5.0000 mg | Freq: Once | ORAL | Status: DC | PRN

## 2020-01-16 MED ORDER — ACETAMINOPHEN 650 MG RE SUPP: 650.0000 mg | RECTAL | Status: DC | PRN

## 2020-01-16 MED ORDER — CHLORHEXIDINE GLUCONATE CLOTH 2 % EX PADS: 6.0000 | MEDICATED_PAD | Freq: Once | CUTANEOUS | Status: DC

## 2020-01-16 MED ORDER — SODIUM CHLORIDE 0.9 % IV SOLN: 250.0000 mL | INTRAVENOUS | Status: DC | PRN

## 2020-01-16 MED ORDER — LIDOCAINE 2% (20 MG/ML) 5 ML SYRINGE
INTRAMUSCULAR | Status: AC
  Filled 2020-01-16: qty 5

## 2020-01-16 MED ORDER — EPHEDRINE 5 MG/ML INJ
INTRAVENOUS | Status: AC
  Filled 2020-01-16: qty 10

## 2020-01-16 MED ORDER — ONDANSETRON HCL 4 MG/2ML IJ SOLN
INTRAMUSCULAR | Status: AC
  Filled 2020-01-16: qty 2

## 2020-01-16 MED ORDER — PROPOFOL 10 MG/ML IV BOLUS
INTRAVENOUS | Status: DC | PRN
  Administered 2020-01-16: 150 mg via INTRAVENOUS
  Administered 2020-01-16: 25 ug/kg/min via INTRAVENOUS

## 2020-01-16 MED ORDER — SUFENTANIL CITRATE 50 MCG/ML IV SOLN
INTRAVENOUS | Status: DC | PRN
  Administered 2020-01-16: 10 ug via INTRAVENOUS
  Administered 2020-01-16: 5 ug via INTRAVENOUS
  Administered 2020-01-16: 15 ug via INTRAVENOUS
  Administered 2020-01-16: 5 ug via INTRAVENOUS

## 2020-01-16 MED ORDER — LIDOCAINE HCL (CARDIAC) PF 100 MG/5ML IV SOSY
PREFILLED_SYRINGE | INTRAVENOUS | Status: DC | PRN
  Administered 2020-01-16: 100 mg via INTRAVENOUS

## 2020-01-16 MED ORDER — OXYCODONE HCL 5 MG PO TABS: 5.0000 mg | ORAL_TABLET | ORAL | Status: DC | PRN

## 2020-01-16 MED ORDER — ROCURONIUM BROMIDE 10 MG/ML (PF) SYRINGE
PREFILLED_SYRINGE | INTRAVENOUS | Status: AC
  Filled 2020-01-16: qty 10

## 2020-01-16 MED ORDER — PROPOFOL 500 MG/50ML IV EMUL
INTRAVENOUS | Status: AC
  Filled 2020-01-16: qty 50

## 2020-01-16 MED ORDER — DEXAMETHASONE SODIUM PHOSPHATE 10 MG/ML IJ SOLN
INTRAMUSCULAR | Status: AC
  Filled 2020-01-16: qty 1

## 2020-01-16 MED ORDER — SODIUM CHLORIDE 0.9% FLUSH: 3.0000 mL | INTRAVENOUS | Status: DC | PRN

## 2020-01-16 MED ORDER — HYDROMORPHONE HCL 1 MG/ML IJ SOLN
INTRAMUSCULAR | Status: AC
  Filled 2020-01-16: qty 0.5

## 2020-01-16 MED ORDER — PHENYLEPHRINE 40 MCG/ML (10ML) SYRINGE FOR IV PUSH (FOR BLOOD PRESSURE SUPPORT)
PREFILLED_SYRINGE | INTRAVENOUS | Status: AC
  Filled 2020-01-16: qty 10

## 2020-01-16 MED ORDER — PROMETHAZINE HCL 25 MG/ML IJ SOLN: 6.2500 mg | INTRAMUSCULAR | Status: DC | PRN

## 2020-01-16 MED ORDER — BUPIVACAINE HCL (PF) 0.25 % IJ SOLN
INTRAMUSCULAR | Status: DC | PRN
  Administered 2020-01-16: 17 mL

## 2020-01-16 MED ORDER — CEFAZOLIN SODIUM-DEXTROSE 2-4 GM/100ML-% IV SOLN
2.0000 g | INTRAVENOUS | Status: AC
  Administered 2020-01-16: 2 g via INTRAVENOUS

## 2020-01-16 MED ORDER — MIDAZOLAM HCL 2 MG/2ML IJ SOLN
INTRAMUSCULAR | Status: AC
  Filled 2020-01-16: qty 2

## 2020-01-16 MED ORDER — OXYCODONE HCL 5 MG PO TABS: 5.0000 mg | ORAL_TABLET | Freq: Once | ORAL | Status: DC | PRN

## 2020-01-16 MED ORDER — LACTATED RINGERS IV SOLN: INTRAVENOUS | Status: DC

## 2020-01-16 MED ORDER — SCOPOLAMINE 1 MG/3DAYS TD PT72
1.0000 | MEDICATED_PATCH | TRANSDERMAL | Status: DC
  Administered 2020-01-16: 07:00:00 1.5 mg via TRANSDERMAL

## 2020-01-16 MED ORDER — FENTANYL CITRATE (PF) 100 MCG/2ML IJ SOLN
INTRAMUSCULAR | Status: AC
  Filled 2020-01-16: qty 2

## 2020-01-16 MED ORDER — HYDROMORPHONE HCL 1 MG/ML IJ SOLN
0.2500 mg | INTRAMUSCULAR | Status: DC | PRN
  Administered 2020-01-16: 0.5 mg via INTRAVENOUS

## 2020-01-16 MED ORDER — SUGAMMADEX SODIUM 200 MG/2ML IV SOLN
INTRAVENOUS | Status: DC | PRN
  Administered 2020-01-16: 200 mg via INTRAVENOUS

## 2020-01-16 MED ORDER — SCOPOLAMINE 1 MG/3DAYS TD PT72
MEDICATED_PATCH | TRANSDERMAL | Status: AC
  Filled 2020-01-16: qty 1

## 2020-01-16 MED ORDER — MIDAZOLAM HCL 5 MG/5ML IJ SOLN
INTRAMUSCULAR | Status: DC | PRN
  Administered 2020-01-16: 2 mg via INTRAVENOUS

## 2020-01-16 MED ORDER — DIPHENHYDRAMINE HCL 50 MG/ML IJ SOLN
INTRAMUSCULAR | Status: AC
  Filled 2020-01-16: qty 1

## 2020-01-16 MED ORDER — ROCURONIUM BROMIDE 100 MG/10ML IV SOLN
INTRAVENOUS | Status: DC | PRN
  Administered 2020-01-16: 90 mg via INTRAVENOUS

## 2020-01-16 MED ORDER — SODIUM CHLORIDE 0.9% FLUSH: 3.0000 mL | Freq: Two times a day (BID) | INTRAVENOUS | Status: DC

## 2020-01-16 MED ORDER — DIPHENHYDRAMINE HCL 50 MG/ML IJ SOLN
INTRAMUSCULAR | Status: DC | PRN
  Administered 2020-01-16: 12.5 mg via INTRAVENOUS

## 2020-01-16 MED ORDER — ACETAMINOPHEN 325 MG PO TABS: 650.0000 mg | ORAL_TABLET | ORAL | Status: DC | PRN

## 2020-01-16 MED ORDER — CEFAZOLIN SODIUM-DEXTROSE 2-4 GM/100ML-% IV SOLN
INTRAVENOUS | Status: AC
  Filled 2020-01-16: qty 100

## 2020-01-16 MED ORDER — LIDOCAINE-EPINEPHRINE 1 %-1:100000 IJ SOLN
INTRAMUSCULAR | Status: DC | PRN
  Administered 2020-01-16: 17 mL

## 2020-01-16 MED ORDER — DEXAMETHASONE SODIUM PHOSPHATE 10 MG/ML IJ SOLN
INTRAMUSCULAR | Status: DC | PRN
  Administered 2020-01-16: 10 mg via INTRAVENOUS

## 2020-01-16 MED ORDER — ONDANSETRON HCL 4 MG/2ML IJ SOLN
INTRAMUSCULAR | Status: DC | PRN
  Administered 2020-01-16: 4 mg via INTRAVENOUS

## 2020-01-16 SURGICAL SUPPLY — 64 items
BAG DECANTER FOR FLEXI CONT (MISCELLANEOUS) ×2 IMPLANT
BINDER BREAST LRG (GAUZE/BANDAGES/DRESSINGS) IMPLANT
BINDER BREAST MEDIUM (GAUZE/BANDAGES/DRESSINGS) IMPLANT
BINDER BREAST XLRG (GAUZE/BANDAGES/DRESSINGS) ×1 IMPLANT
BINDER BREAST XXLRG (GAUZE/BANDAGES/DRESSINGS) IMPLANT
BIOPATCH RED 1 DISK 7.0 (GAUZE/BANDAGES/DRESSINGS) IMPLANT
BLADE HEX COATED 2.75 (ELECTRODE) ×2 IMPLANT
BLADE KNIFE PERSONA 10 (BLADE) ×4 IMPLANT
BLADE SURG 10 STRL SS (BLADE) ×2 IMPLANT
BLADE SURG 15 STRL LF DISP TIS (BLADE) IMPLANT
BLADE SURG 15 STRL SS (BLADE)
CANISTER SUCT 1200ML W/VALVE (MISCELLANEOUS) ×2 IMPLANT
COVER BACK TABLE 60X90IN (DRAPES) ×2 IMPLANT
COVER MAYO STAND STRL (DRAPES) ×2 IMPLANT
COVER WAND RF STERILE (DRAPES) IMPLANT
DECANTER SPIKE VIAL GLASS SM (MISCELLANEOUS) IMPLANT
DERMABOND ADVANCED (GAUZE/BANDAGES/DRESSINGS)
DERMABOND ADVANCED .7 DNX12 (GAUZE/BANDAGES/DRESSINGS) IMPLANT
DRAIN CHANNEL 19F RND (DRAIN) IMPLANT
DRAPE LAPAROSCOPIC ABDOMINAL (DRAPES) ×2 IMPLANT
DRSG OPSITE POSTOP 4X6 (GAUZE/BANDAGES/DRESSINGS) ×2 IMPLANT
DRSG PAD ABDOMINAL 8X10 ST (GAUZE/BANDAGES/DRESSINGS) ×6 IMPLANT
ELECT BLADE 4.0 EZ CLEAN MEGAD (MISCELLANEOUS)
ELECT REM PT RETURN 9FT ADLT (ELECTROSURGICAL) ×2
ELECTRODE BLDE 4.0 EZ CLN MEGD (MISCELLANEOUS) IMPLANT
ELECTRODE REM PT RTRN 9FT ADLT (ELECTROSURGICAL) ×1 IMPLANT
EVACUATOR SILICONE 100CC (DRAIN) IMPLANT
GAUZE SPONGE 4X4 12PLY STRL LF (GAUZE/BANDAGES/DRESSINGS) IMPLANT
GLOVE BIO SURGEON STRL SZ 6.5 (GLOVE) ×8 IMPLANT
GLOVE BIO SURGEON STRL SZ7 (GLOVE) ×1 IMPLANT
GOWN STRL REUS W/ TWL LRG LVL3 (GOWN DISPOSABLE) ×3 IMPLANT
GOWN STRL REUS W/TWL LRG LVL3 (GOWN DISPOSABLE) ×3
NDL HYPO 25X1 1.5 SAFETY (NEEDLE) ×1 IMPLANT
NDL SAFETY ECLIPSE 18X1.5 (NEEDLE) IMPLANT
NEEDLE HYPO 18GX1.5 SHARP (NEEDLE)
NEEDLE HYPO 25X1 1.5 SAFETY (NEEDLE) ×2 IMPLANT
NS IRRIG 1000ML POUR BTL (IV SOLUTION) ×1 IMPLANT
PACK BASIN DAY SURGERY FS (CUSTOM PROCEDURE TRAY) ×2 IMPLANT
PAD ALCOHOL SWAB (MISCELLANEOUS) IMPLANT
PENCIL SMOKE EVACUATOR (MISCELLANEOUS) ×2 IMPLANT
PIN SAFETY STERILE (MISCELLANEOUS) IMPLANT
SLEEVE SCD COMPRESS KNEE MED (MISCELLANEOUS) ×2 IMPLANT
SPONGE LAP 18X18 RF (DISPOSABLE) ×5 IMPLANT
STRIP SUTURE WOUND CLOSURE 1/2 (MISCELLANEOUS) ×5 IMPLANT
SUT MNCRL AB 4-0 PS2 18 (SUTURE) ×10 IMPLANT
SUT MON AB 3-0 SH 27 (SUTURE) ×3
SUT MON AB 3-0 SH27 (SUTURE) ×3 IMPLANT
SUT MON AB 5-0 PS2 18 (SUTURE) ×7 IMPLANT
SUT PDS 3-0 CT2 (SUTURE)
SUT PDS AB 2-0 CT2 27 (SUTURE) IMPLANT
SUT PDS II 3-0 CT2 27 ABS (SUTURE) IMPLANT
SUT SILK 3 0 PS 1 (SUTURE) IMPLANT
SYR 3ML 23GX1 SAFETY (SYRINGE) IMPLANT
SYR 50ML LL SCALE MARK (SYRINGE) IMPLANT
SYR BULB IRRIGATION 50ML (SYRINGE) ×2 IMPLANT
SYR CONTROL 10ML LL (SYRINGE) ×2 IMPLANT
TAPE MEASURE VINYL STERILE (MISCELLANEOUS) IMPLANT
TOWEL GREEN STERILE FF (TOWEL DISPOSABLE) ×4 IMPLANT
TRAY DSU PREP LF (CUSTOM PROCEDURE TRAY) ×2 IMPLANT
TUBE CONNECTING 20X1/4 (TUBING) ×2 IMPLANT
TUBING INFILTRATION IT-10001 (TUBING) IMPLANT
TUBING SET GRADUATE ASPIR 12FT (MISCELLANEOUS) IMPLANT
UNDERPAD 30X36 HEAVY ABSORB (UNDERPADS AND DIAPERS) ×4 IMPLANT
YANKAUER SUCT BULB TIP NO VENT (SUCTIONS) ×2 IMPLANT

## 2020-01-16 NOTE — Op Note (Signed)
Breast Reduction Op note:    DATE OF PROCEDURE: 01/16/2020  LOCATION: [facility]  SURGEON: Lyndee Leo Sanger Nayshawn Mesta, DO  ASSISTANT: Phoebe Sharps, PA  PREOPERATIVE DIAGNOSIS 1. Macromastia 2. Neck Pain 3. Back Pain  POSTOPERATIVE DIAGNOSIS 1. Macromastia 2. Neck Pain 3. Back Pain  PROCEDURES 1. Bilateral breast reduction.  Right reduction 558 g, Left reduction [id number] g  COMPLICATIONS: None.  DRAINS: none  INDICATIONS FOR PROCEDURE Laura Watson is a 39 y.o. year-old female born on Oct 29, 1980,with a history of symptomatic macromastia with concominant back pain, neck pain, shoulder grooving from her bra.   MRN: [id number]  CONSENT Informed consent was obtained directly from the patient. The risks, benefits and alternatives were fully discussed. Specific risks including but not limited to bleeding, infection, hematoma, seroma, scarring, pain, nipple necrosis, asymmetry, poor cosmetic results, and need for further surgery were discussed. The patient had ample opportunity to have her questions answered to her satisfaction.  DESCRIPTION OF PROCEDURE  Patient was brought into the operating room and placed in a supine position.  SCDs were placed and appropriate padding was performed.  Antibiotics were given. The patient underwent general anesthesia and the chest was prepped and draped in a sterile fashion.  A timeout was performed and all information was confirmed to be correct.  Right side: Preoperative markings were confirmed.  Incision lines were injected with 1% Xylocaine with epinephrine.  After waiting for vasoconstriction, the marked lines were incised.  A Wise-pattern superomedial breast reduction was performed by de-epithelializing the pedicle, using bovie to create the superomedial pedicle, and removing breast tissue from the superior, lateral, and inferior portions of the breast.  Care was taken to not undermine the breast pedicle. Hemostasis was  achieved.  The nipple was gently rotated into position and the soft tissue closed with 4-0 Monocryl.   The pocket was irrigated and hemostasis confirmed.  The deep tissues were approximated with 3-0 Monocryl sutures and the skin was closed with deep dermal and subcuticular 4-0 Monocryl sutures.  The nipple and skin flaps had good capillary refill at the end of the procedure.    Left side: Preoperative markings were confirmed.  Incision lines were injected with 1% Xylocaine with epinephrine.  After waiting for vasoconstriction, the marked lines were incised.  A Wise-pattern superomedial breast reduction was performed by de-epithelializing the pedicle, using bovie to create the superomedial pedicle, and removing breast tissue from the superior, lateral, and inferior portions of the breast.  Care was taken to not undermine the breast pedicle. Hemostasis was achieved.  The nipple was gently rotated into position and the soft tissue was closed with 4-0 Monocryl.  The patient was sat upright and size and shape symmetry was confirmed.  The pocket was irrigated and hemostasis confirmed.  The deep tissues were approximated with 3-0 Monocryl sutures and the skin was closed with deep dermal and subcuticular 4-0 Monocryl sutures.  Dermabond was applied.  A breast binder and ABDs were placed.  The nipple and skin flaps had good capillary refill at the end of the procedure.  The patient tolerated the procedure well. The patient was allowed to wake from anesthesia and taken to the recovery room in satisfactory condition  The advanced practice practitioner (APP) assisted throughout the case.  The APP was essential in retraction and counter traction when needed to make the case progress smoothly.  This retraction and assistance made it possible to see the tissue plans for the procedure.  The assistance was needed for  blood control, tissue re-approximation and assisted with closure of the incision site.  The [place] was signed into law in 2016 which includes the topic of electronic health records.  This provides immediate access to information in MyChart.  This includes consultation notes, operative notes, office notes, lab results and pathology reports.  If you have any questions about what you read please let us know at your next visit or call us at the office.  We are right here with you.

## 2020-01-16 NOTE — Anesthesia Preprocedure Evaluation (Signed)
Anesthesia Evaluation  Patient identified by MRN, date of birth, ID band Patient awake    Reviewed: Allergy & Precautions, NPO status , Patient's Chart, lab work & pertinent test results  History of Anesthesia Complications (+) PONV  Airway Mallampati: II  TM Distance: >3 FB Neck ROM: Full    Dental no notable dental hx.    Pulmonary neg pulmonary ROS,    Pulmonary exam normal breath sounds clear to auscultation       Cardiovascular negative cardio ROS Normal cardiovascular exam Rhythm:Regular Rate:Normal     Neuro/Psych Anxiety Depression negative neurological ROS  negative psych ROS   GI/Hepatic negative GI ROS, Neg liver ROS,   Endo/Other  negative endocrine ROS  Renal/GU negative Renal ROS  negative genitourinary   Musculoskeletal negative musculoskeletal ROS (+)   Abdominal (+) + obese,   Peds negative pediatric ROS (+)  Hematology negative hematology ROS (+)   Anesthesia Other Findings   Reproductive/Obstetrics negative OB ROS                             Anesthesia Physical Anesthesia Plan  ASA: II  Anesthesia Plan: General   Post-op Pain Management:    Induction: Intravenous  PONV Risk Score and Plan: 4 or greater and Ondansetron, Dexamethasone, Midazolam, Scopolamine patch - Pre-op and Treatment may vary due to age or medical condition  Airway Management Planned: Oral ETT  Additional Equipment:   Intra-op Plan:   Post-operative Plan: Extubation in OR  Informed Consent: I have reviewed the patients History and Physical, chart, labs and discussed the procedure including the risks, benefits and alternatives for the proposed anesthesia with the patient or authorized representative who has indicated his/her understanding and acceptance.     Dental advisory given  Plan Discussed with: CRNA  Anesthesia Plan Comments:         Anesthesia Quick Evaluation

## 2020-01-16 NOTE — Interval H&P Note (Signed)
History and Physical Interval Note:  01/16/2020 7:53 AM  Laura Watson  has presented today for surgery, with the diagnosis of mammary hypertrophy.  The various methods of treatment have been discussed with the patient and family. After consideration of risks, benefits and other options for treatment, the patient has consented to  Procedure(s) with comments: MAMMARY REDUCTION  (BREAST) (Bilateral) - 3.5 hours as a surgical intervention.  The patient's history has been reviewed, patient examined, no change in status, stable for surgery.  I have reviewed the patient's chart and labs.  Questions were answered to the patient's satisfaction.     Loel Lofty Dequan [facility]

## 2020-01-16 NOTE — Discharge Instructions (Signed)
INSTRUCTIONS FOR AFTER SURGERY   You will likely have some questions about what to expect following your operation.  The following information will help you and your family understand what to expect when you are discharged from the hospital.  Following these guidelines will help ensure a smooth recovery and reduce risks of complications.  Postoperative instructions include information on: diet, wound care, medications and physical activity.  AFTER SURGERY Expect to go home after the procedure.  In some cases, you may need to spend one night in the hospital for observation.  DIET This surgery does not require a specific diet.  However, I have to mention that the healthier you eat the better your body can start healing. It is important to increasing your protein intake.  This means limiting the foods with added sugar.  Focus on fruits and vegetables and some meat.  If you have any liposuction during your procedure be sure to drink water.  If your urine is bright yellow, then it is concentrated, and you need to drink more water.  As a general rule after surgery, you should have 8 ounces of water every hour while awake.  If you find you are persistently nauseated or unable to take in liquids let us know.  NO TOBACCO USE or EXPOSURE.  This will slow your healing process and increase the risk of a wound.  WOUND CARE If you don't have a drain: You can shower the day after surgery.  Use fragrance free soap.  Dial, Dove, Ivory and Cetaphil are usually mild on the skin.  If you have steri-strips / tape directly attached to your skin leave them in place. It is OK to get these wet.  No baths, pools or hot tubs for two weeks. We close your incision to leave the smallest and best-looking scar. No ointment or creams on your incisions until given the go ahead.  Especially not Neosporin (Too many skin reactions with this one).  A few weeks after surgery you can use Mederma and start massaging the scar. We ask you to  wear your binder or sports bra for the first 6 weeks around the clock, including while sleeping. This provides added comfort and helps reduce the fluid accumulation at the surgery site.  ACTIVITY No heavy lifting until cleared by the doctor.  It is OK to walk and climb stairs. In fact, moving your legs is very important to decrease your risk of a blood clot.  It will also help keep you from getting deconditioned.  Every 1 to 2 hours get up and walk for 5 minutes. This will help with a quicker recovery back to normal.  Let pain be your guide so you don't do too much.  NO, you cannot do the spring cleaning and don't plan on taking care of anyone else.  This is your time for TLC.   WORK Everyone returns to work at different times. As a rough guide, most people take at least 1 - 2 weeks off prior to returning to work. If you need documentation for your job, bring the forms to your postoperative follow up visit.  DRIVING Arrange for someone to bring you home from the hospital.  You may be able to drive a few days after surgery but not while taking any narcotics or valium.  BOWEL MOVEMENTS Constipation can occur after anesthesia and while taking pain medication.  It is important to stay ahead for your comfort.  We recommend taking Milk of Magnesia (2 tablespoons; twice   a day) while taking the pain pills.  SEROMA This is fluid your body tried to put in the surgical site.  This is normal but if it creates excessive pain and swelling let us know.  It usually decreases in a few weeks.  MEDICATIONS and PAIN CONTROL At your preoperative visit for you history and physical you were given the following medications: 1. An antibiotic: Start this medication when you get home and take according to the instructions on the bottle. 2. Zofran 4 mg:  This is to treat nausea and vomiting.  You can take this every 6 hours as needed and only if needed. 3. Norco (hydrocodone/acetaminophen) 5/325 mg:  This is only to be  used after you have taken the motrin or the tylenol. Every 8 hours as needed. Over the counter Medication to take: 4. Ibuprofen (Motrin) 600 mg:  Take this every 6 hours.  If you have additional pain then take 500 mg of the tylenol.  Only take the Norco after you have tried these two. 5. Miralax or stool softener of choice: Take this according to the bottle if you take the Norco.  WHEN TO CALL Call your surgeon's office if any of the following occur: . Fever 101 degrees F or greater . Excessive bleeding or fluid from the incision site. . Pain that increases over time without aid from the medications . Redness, warmth, or pus draining from incision sites . Persistent nausea or inability to take in liquids . Severe misshapen area that underwent the operation.   Post Anesthesia Home Care Instructions  Activity: Get plenty of rest for the remainder of the day. A responsible individual must stay with you for 24 hours following the procedure.  For the next 24 hours, DO NOT: -Drive a car -Operate machinery -Drink alcoholic beverages -Take any medication unless instructed by your physician -Make any legal decisions or sign important papers.  Meals: Start with liquid foods such as gelatin or soup. Progress to regular foods as tolerated. Avoid greasy, spicy, heavy foods. If nausea and/or vomiting occur, drink only clear liquids until the nausea and/or vomiting subsides. Call your physician if vomiting continues.  Special Instructions/Symptoms: Your throat may feel dry or sore from the anesthesia or the breathing tube placed in your throat during surgery. If this causes discomfort, gargle with warm salt water. The discomfort should disappear within 24 hours.  If you had a scopolamine patch placed behind your ear for the management of post- operative nausea and/or vomiting:  1. The medication in the patch is effective for 72 hours, after which it should be removed.  Wrap patch in a tissue and  discard in the trash. Wash hands thoroughly with soap and water. 2. You may remove the patch earlier than 72 hours if you experience unpleasant side effects which may include dry mouth, dizziness or visual disturbances. 3. Avoid touching the patch. Wash your hands with soap and water after contact with the patch.     

## 2020-01-16 NOTE — Anesthesia Procedure Notes (Signed)
Procedure Name: Intubation Date/Time: 01/16/2020 8:09 AM Performed by: Willa Frater, CRNA Pre-anesthesia Checklist: Patient identified, Emergency Drugs available, Suction available and Patient being monitored Patient Re-evaluated:Patient Re-evaluated prior to induction Oxygen Delivery Method: Circle system utilized Preoxygenation: Pre-oxygenation with 100% oxygen Induction Type: IV induction Ventilation: Mask ventilation without difficulty Laryngoscope Size: Mac and 3 Grade View: Grade I Tube type: Oral Number of attempts: 1 Airway Equipment and Method: Stylet and Oral airway Placement Confirmation: ETT inserted through vocal cords under direct vision,  positive ETCO2 and breath sounds checked- equal and bilateral Secured at: 22 cm Tube secured with: Tape Dental Injury: Teeth and Oropharynx as per pre-operative assessment

## 2020-01-16 NOTE — Transfer of Care (Signed)
Immediate Anesthesia Transfer of Care Note  Patient: Laura Watson  Procedure(s) Performed: MAMMARY REDUCTION  (BREAST) (Bilateral Breast)  Patient Location: PACU  Anesthesia Type:General  Level of Consciousness: awake, alert  and oriented  Airway & Oxygen Therapy: Patient Spontanous Breathing and Patient connected to face mask oxygen  Post-op Assessment: Report given to RN and Post -op Vital signs reviewed and stable  Post vital signs: Reviewed and stable  Last Vitals:  Vitals Value Taken Time  BP    Temp    Pulse 124 01/16/20 1042  Resp    SpO2 97 % 01/16/20 1042  Vitals shown include unvalidated device data.  Last Pain:  Vitals:   01/16/20 0629  TempSrc: Oral  PainSc: 0-No pain      Patients Stated Pain Goal: 4 ([id number])  Complications: No apparent anesthesia complications

## 2020-01-16 NOTE — Anesthesia Postprocedure Evaluation (Signed)
Anesthesia Post Note  Patient: Laura Watson  Procedure(s) Performed: MAMMARY REDUCTION  (BREAST) (Bilateral Breast)     Patient location during evaluation: PACU Anesthesia Type: General Level of consciousness: awake and alert Pain management: pain level controlled Vital Signs Assessment: post-procedure vital signs reviewed and stable Respiratory status: spontaneous breathing, nonlabored ventilation and respiratory function stable Cardiovascular status: blood pressure returned to baseline and stable Postop Assessment: no apparent nausea or vomiting Anesthetic complications: no    Last Vitals:  Vitals:   01/16/20 1134 01/16/20 1138  BP:  114/68  Pulse:  (!) 106  Resp:  16  Temp:  37.1 C  SpO2: 99% 99%    Last Pain:  Vitals:   01/16/20 1138  TempSrc: Oral  PainSc:                  Lynda Rainwater

## 2020-01-17 ENCOUNTER — Encounter: Payer: Self-pay | Admitting: *Deleted

## 2020-01-17 LAB — SURGICAL PATHOLOGY

## 2020-01-21 ENCOUNTER — Ambulatory Visit: Payer: BC Managed Care – PPO | Attending: Internal Medicine

## 2020-01-21 ENCOUNTER — Telehealth: Payer: Self-pay | Admitting: Plastic Surgery

## 2020-01-21 ENCOUNTER — Other Ambulatory Visit: Payer: Self-pay

## 2020-01-21 DIAGNOSIS — Z23 Encounter for immunization: Secondary | ICD-10-CM

## 2020-01-21 NOTE — Telephone Encounter (Signed)
Pt called to say that she is having an allergic reaction to the tape on her right breast. Please call patient to advise what can be done overnight.   Due to the office being closed on Friday, I rescheduled her post op to tomorrow.

## 2020-01-21 NOTE — Telephone Encounter (Signed)
Returned patients call. LMVM to stop using the tape, can use ABD pads or maxi pads and place in binder.  Take benadryl for due to the allergic reaction and may cause drowsiness. Reminded follow up appointment is tomorrow at 9:45.

## 2020-01-21 NOTE — Progress Notes (Signed)
   Covid-19 Vaccination Clinic  Name:  Laura Watson    MRN: [id number] DOB: Nov 10, 1980  01/21/2020  Ms. Laura Watson was observed post Covid-19 immunization for 15 minutes without incident. She was provided with Vaccine Information Sheet and instruction to access the V-Safe system.   Ms. Laura Watson was instructed to call 911 with any severe reactions post vaccine: Marland Kitchen Difficulty breathing  . Swelling of face and throat  . A fast heartbeat  . A bad rash all over body  . Dizziness and weakness   Immunizations Administered    Name Date Dose VIS Date Route   Pfizer COVID-19 Vaccine 01/21/2020 11:09 AM 0.3 mL 10/05/2019 Intramuscular   Manufacturer: [place]   Lot: [id number]   [place]: [id number]

## 2020-01-22 ENCOUNTER — Ambulatory Visit (INDEPENDENT_AMBULATORY_CARE_PROVIDER_SITE_OTHER): Payer: BC Managed Care – PPO | Admitting: Plastic Surgery

## 2020-01-22 ENCOUNTER — Encounter: Payer: Self-pay | Admitting: Plastic Surgery

## 2020-01-22 VITALS — BP 108/80 | HR 90 | Temp 98.2°F | Ht 64.0 in | Wt 196.0 lb

## 2020-01-22 DIAGNOSIS — N62 Hypertrophy of breast: Secondary | ICD-10-CM

## 2020-01-22 NOTE — Progress Notes (Signed)
   Subjective:    Patient ID: Laura Watson, female    DOB: 30-Aug-1981, 39 y.o.   MRN: [id number]  The patient is a 38 year old female here for follow-up on her bilateral breast reduction.  Overall she is doing extremely well.  Her pain is well controlled and she is tolerating food without difficulty.  Her bowels are moving.  She had a little bit of blistering from the Steri-Strips on the right breast.  She trimmed those and that is doing much better.  There does not appear to be any sign of a hematoma.  Her nipple areolas have good color and capillary refill.  Overall she is very pleased.     Review of Systems  Constitutional: Positive for activity change.  HENT: Negative.   Eyes: Negative.   Cardiovascular: Negative.   Genitourinary: Negative.   Musculoskeletal: Negative.        Objective:   Physical Exam Vitals and nursing note reviewed.  Constitutional:      Appearance: Normal appearance.  Cardiovascular:     Rate and Rhythm: Normal rate.     Pulses: Normal pulses.  Pulmonary:     Effort: Pulmonary effort is normal.  Neurological:     General: No focal deficit present.     Mental Status: She is alert. Mental status is at baseline.  Psychiatric:        Mood and Affect: Mood normal.        Behavior: Behavior normal.       Assessment & Plan:     ICD-10-CM   1. Symptomatic mammary hypertrophy  N62     She can transition to the sports bra from the binder.  Whichever feels more comfortable.  And we will see her back in 2 weeks.  If she has any questions or concerns she knows to give [place] a call.

## 2020-01-23 ENCOUNTER — Encounter: Payer: Self-pay | Admitting: Plastic Surgery

## 2020-01-25 ENCOUNTER — Encounter: Payer: BC Managed Care – PPO | Admitting: Plastic Surgery

## 2020-02-08 ENCOUNTER — Ambulatory Visit (INDEPENDENT_AMBULATORY_CARE_PROVIDER_SITE_OTHER): Payer: BC Managed Care – PPO | Admitting: Surgical

## 2020-02-08 ENCOUNTER — Other Ambulatory Visit: Payer: Self-pay

## 2020-02-08 ENCOUNTER — Encounter: Payer: Self-pay | Admitting: Surgical

## 2020-02-08 VITALS — BP 113/80 | HR 84 | Temp 98.2°F | Ht 64.0 in | Wt 196.0 lb

## 2020-02-08 DIAGNOSIS — N62 Hypertrophy of breast: Secondary | ICD-10-CM

## 2020-02-08 DIAGNOSIS — M546 Pain in thoracic spine: Secondary | ICD-10-CM

## 2020-02-08 DIAGNOSIS — Z9889 Other specified postprocedural states: Secondary | ICD-10-CM

## 2020-02-08 DIAGNOSIS — G8929 Other chronic pain: Secondary | ICD-10-CM

## 2020-02-08 DIAGNOSIS — M542 Cervicalgia: Secondary | ICD-10-CM

## 2020-02-08 NOTE — Progress Notes (Signed)
The patient is a 39 year old female here for follow-up after bilateral breast reduction on 01/16/2020 by Dr. Marla Roe.  She is approximately 3-1/2 weeks postop.  She reports that she is doing great.  She is very happy with her outcome.  She reports significant decrease in her neck and back pain.  She has not had any fevers, chills, nausea, vomiting.  There is no sign of any seroma or hematoma.  On exam patient's bilateral breasts are healing nicely with good NAC color and good cap refill.  Vertical limb incisions are healing nicely.  She does have Steri-Strips in place which have begun to fall off.  There is no erythema.  No fluid wave noted.  Nontender to palpation.  All of her incisions are C/D/I.  Plan: Continue to wear sports bra 24/7 for 2 more weeks then she can transition to wearing sports bra just during the day until her follow-up in 2 months.  She can begin sleeping on her side as tolerated.  Avoid sleeping prone.  Steri-Strips removed today.  Follow-up in 2 months, call with any questions or concerns.

## 2020-02-19 ENCOUNTER — Ambulatory Visit: Payer: BC Managed Care – PPO | Attending: Internal Medicine

## 2020-02-19 DIAGNOSIS — Z23 Encounter for immunization: Secondary | ICD-10-CM

## 2020-02-19 NOTE — Progress Notes (Signed)
   Covid-19 Vaccination Clinic  Name:  Laura Watson    MRN: [id number] DOB: January 14, 1981  02/19/2020  Ms. Laura Watson was observed post Covid-19 immunization for 15 minutes without incident. She was provided with Vaccine Information Sheet and instruction to access the V-Safe system.   Ms. Laura Watson was instructed to call 911 with any severe reactions post vaccine: Marland Kitchen Difficulty breathing  . Swelling of face and throat  . A fast heartbeat  . A bad rash all over body  . Dizziness and weakness   Immunizations Administered    Name Date Dose VIS Date Route   Pfizer COVID-19 Vaccine 02/19/2020  1:07 PM 0.3 mL 12/19/2018 Intramuscular   Manufacturer: [place]   Lot: [id number]   [place]: [id number]

## 2020-04-07 ENCOUNTER — Encounter: Payer: Self-pay | Admitting: Family Medicine

## 2020-04-07 ENCOUNTER — Other Ambulatory Visit: Payer: Self-pay

## 2020-04-07 ENCOUNTER — Ambulatory Visit: Payer: BC Managed Care – PPO | Admitting: Family Medicine

## 2020-04-07 VITALS — BP 118/84 | HR 99 | Temp 97.5°F | Resp 14 | Ht 64.0 in | Wt 199.0 lb

## 2020-04-07 DIAGNOSIS — Z5181 Encounter for therapeutic drug level monitoring: Secondary | ICD-10-CM

## 2020-04-07 DIAGNOSIS — G4700 Insomnia, unspecified: Secondary | ICD-10-CM | POA: Diagnosis not present

## 2020-04-07 DIAGNOSIS — Z6830 Body mass index (BMI) 30.0-30.9, adult: Secondary | ICD-10-CM | POA: Diagnosis not present

## 2020-04-07 DIAGNOSIS — E785 Hyperlipidemia, unspecified: Secondary | ICD-10-CM

## 2020-04-07 DIAGNOSIS — R5383 Other fatigue: Secondary | ICD-10-CM

## 2020-04-07 DIAGNOSIS — E559 Vitamin D deficiency, unspecified: Secondary | ICD-10-CM | POA: Diagnosis not present

## 2020-04-07 DIAGNOSIS — F321 Major depressive disorder, single episode, moderate: Secondary | ICD-10-CM | POA: Diagnosis not present

## 2020-04-07 DIAGNOSIS — Z6834 Body mass index (BMI) 34.0-34.9, adult: Secondary | ICD-10-CM

## 2020-04-07 DIAGNOSIS — E6609 Other obesity due to excess calories: Secondary | ICD-10-CM

## 2020-04-07 DIAGNOSIS — F419 Anxiety disorder, unspecified: Secondary | ICD-10-CM | POA: Diagnosis not present

## 2020-04-07 DIAGNOSIS — D72829 Elevated white blood cell count, unspecified: Secondary | ICD-10-CM

## 2020-04-07 MED ORDER — ESCITALOPRAM OXALATE 20 MG PO TABS: 20.0000 mg | ORAL_TABLET | Freq: Every day | ORAL | 3 refills | Status: DC

## 2020-04-07 MED ORDER — TRAZODONE HCL 50 MG PO TABS: 25.0000 mg | ORAL_TABLET | Freq: Every evening | ORAL | 3 refills | Status: DC | PRN

## 2020-04-07 MED ORDER — BUSPIRONE HCL 15 MG PO TABS: 7.5000 mg | ORAL_TABLET | Freq: Two times a day (BID) | ORAL | 3 refills | Status: DC | PRN

## 2020-04-07 NOTE — Progress Notes (Signed)
Name: Laura Watson   MRN: [id number]    DOB: 09/05/1981   Date:04/07/2020       Progress Note  Chief Complaint  Patient presents with  . Follow-up  . Medication Refill  . Anxiety  . Depression     Subjective:   Laura Watson is a 39 y.o. female, presents to clinic for routine follow up on the conditions listed above.  Pt here for f/up on anxiety, depression and associated insomnia. ON lexapro 20 mg was trying buspar, increasing dose after last PCP appt Dec 2020, with consideration to try trazodone at bedtime for sleep  Depression screen [facility] 2/9 04/07/2020 10/11/2019 05/18/2019  Decreased Interest 0 0 0  Down, Depressed, Hopeless 0 0 0  PHQ - 2 Score 0 0 0  Altered sleeping 2 3 1   Tired, decreased energy 3 1 1   Change in appetite 0 0 0  Feeling bad or failure about yourself  0 0 0  Trouble concentrating 0 3 2  Moving slowly or fidgety/restless 0 0 1  Suicidal thoughts 0 0 0  PHQ-9 Score 5 7 5   Difficult doing work/chores Somewhat difficult Somewhat difficult Somewhat difficult   GAD 7 : Generalized Anxiety Score 04/07/2020 01/16/2018 01/16/2018  Nervous, Anxious, on Edge 1 3 3   Control/stop worrying 0 2 2  Worry too much - different things 0 2 2  Trouble relaxing 0 3 3  Restless 1 3 3   Easily annoyed or irritable 1 3 3   Afraid - awful might happen 0 2 2  Total GAD 7 Score 3 18 18   Anxiety Difficulty Not difficult at all Very difficult Very difficult   Still some work stress, harder time falling asleep, still waking up very often at night Her anxiety and depression are better than it has been    Patient Active Problem List   Diagnosis Date Noted  . Mass of right side of neck 10/11/2019  . Back pain 08/14/2019  . Neck pain 08/14/2019  . Symptomatic mammary hypertrophy 08/14/2019  . Anxiety disorder 01/16/2018  . Current moderate episode of major depressive disorder without prior episode ([place]) 01/16/2018  . Class 1 obesity due to excess calories without serious  comorbidity with body mass index (BMI) of 30.0 to 30.9 in adult 01/16/2018  . Family history of diabetes mellitus 01/16/2018  . Fatigue 01/16/2018  . History of vitamin D deficiency 01/16/2018    Past Surgical History:  Procedure Laterality Date  . APPENDECTOMY  2009  . BREAST REDUCTION SURGERY Bilateral 01/16/2020   Procedure: MAMMARY REDUCTION  (BREAST);  Surgeon: Wallace Going, DO;  Location: [place];  Service: Plastics;  Laterality: Bilateral;  3.5 hours  . CESAREAN SECTION     x2    Family History  Problem Relation Age of Onset  . Diabetes Mother   . Cancer Mother 60       lymphoma  . Diabetes Father   . Pulmonary fibrosis Father   . Diabetes Sister   . Diabetes Brother   . Dementia Maternal Grandmother   . Alzheimer's disease Maternal Grandfather   . Breast cancer Neg Hx     Social History   Tobacco Use  . Smoking status: Never Smoker  . Smokeless tobacco: Never Used  Vaping Use  . Vaping Use: Never used  Substance Use Topics  . Alcohol use: Yes    Comment: occ  . Drug use: No      Current Outpatient Medications:  .  acetaminophen (TYLENOL) 325 MG tablet, Take 650 mg by mouth as needed., Disp: , Rfl:  .  azelastine (ASTELIN) 0.1 % nasal spray, Place 2 sprays into both nostrils 2 (two) times daily. Use in each nostril as directed, Disp: 30 mL, Rfl: 12 .  busPIRone (BUSPAR) 15 MG tablet, Take 1 tablet (15 mg total) by mouth at bedtime., Disp: 90 tablet, Rfl: 1 .  escitalopram (LEXAPRO) 20 MG tablet, Take 1 tablet (20 mg total) by mouth daily., Disp: 90 tablet, Rfl: 1 .  fluticasone (FLONASE) 50 MCG/ACT nasal spray, Place 2 sprays into both nostrils daily., Disp: 16 g, Rfl: 6 .  ibuprofen (ADVIL,MOTRIN) 200 MG tablet, Take 400 mg by mouth as needed., Disp: , Rfl:  .  levocetirizine (XYZAL) 5 MG tablet, Take 1 tablet (5 mg total) by mouth every evening., Disp: 90 tablet, Rfl: 1 .  levonorgestrel (MIRENA) 20 MCG/24HR IUD, 1 Intra Uterine  Device by Intrauterine route continuous., Disp: , Rfl:  .  ondansetron (ZOFRAN) 4 MG tablet, Take 1 tablet (4 mg total) by mouth every 8 (eight) hours as needed for nausea or vomiting., Disp: 20 tablet, Rfl: 0  No Known Allergies  Chart Review Today: I personally reviewed active problem list, medication list, allergies, family history, social history, health maintenance, notes from last encounter, lab results, imaging with the patient/caregiver today.   Review of Systems  10 Systems reviewed and are negative for acute change except as noted in the HPI.  Objective:    Vitals:   04/07/20 0810  BP: 118/84  Pulse: 99  Resp: 14  Temp: (!) 97.5 F (36.4 C)  SpO2: 99%  Weight: 199 lb (90.3 kg)  Height: 5\' 4"  (1.626 m)    Body mass index is 34.16 kg/m.  Physical Exam Vitals and nursing note reviewed.  Constitutional:      General: She is not in acute distress.    Appearance: Normal appearance. She is obese. She is not ill-appearing, toxic-appearing or diaphoretic.  HENT:     Head: Normocephalic.     Right Ear: External ear normal.     Left Ear: External ear normal.  Eyes:     General: No scleral icterus.       Right eye: No discharge.        Left eye: No discharge.  Cardiovascular:     Rate and Rhythm: Normal rate and regular rhythm.     Heart sounds: Normal heart sounds.  Pulmonary:     Effort: Pulmonary effort is normal.     Breath sounds: Normal breath sounds.  Skin:    General: Skin is warm and dry.     Coloration: Skin is not jaundiced or pale.  Neurological:     Mental Status: She is alert.  Psychiatric:        Behavior: Behavior normal.       Fall Risk: Fall Risk  04/07/2020 10/11/2019 05/18/2019 02/16/2019 01/18/2019  Falls in the past year? 0 0 0 0 0  Number falls in past yr: 0 0 0 0 0  Injury with Fall? 0 0 0 0 0  Follow up - - Falls evaluation completed Falls evaluation completed Falls evaluation completed     Assessment & Plan:    1. Current  moderate episode of major depressive disorder without prior episode ([place]) PHQ 9 and gad 7 reviewed today, both improving She is taking BuSpar once a day at bedtime Lexapro in the morning compliant with medications without any side effects or  concerns at this time.  - busPIRone (BUSPAR) 15 MG tablet; Take 0.5-1 tablets (7.5-15 mg total) by mouth 2 (two) times daily as needed.  Dispense: 180 tablet; Refill: 3 - escitalopram (LEXAPRO) 20 MG tablet; Take 1 tablet (20 mg total) by mouth daily.  Dispense: 90 tablet; Refill: 3 - TSH  2. Anxiety disorder, unspecified type Improved anxiety overall with some persistent situational anxiety due to work stress.  Some of her health issues that cause increased stress are improved or resolving.  She is taking Lexapro in the morning and BuSpar at bedtime and overall feels that this regimen is working for her anxiety symptoms Does continue to have excessive worry and thoughts especially at bedtime - busPIRone (BUSPAR) 15 MG tablet; Take 0.5-1 tablets (7.5-15 mg total) by mouth 2 (two) times daily as needed.  Dispense: 180 tablet; Refill: 3 - escitalopram (LEXAPRO) 20 MG tablet; Take 1 tablet (20 mg total) by mouth daily.  Dispense: 90 tablet; Refill: 3  3. Insomnia, unspecified type Patient has worsening insomnia, onset and worsening over the past year. She has had her anxiety and depression improve, previously was having difficulty staying asleep was waking up about every 2 hours.  Now she is having difficulty going to sleep and staying asleep.  She does not suspect any sleep apnea but does not of any snoring.  Things are not waking her up -such as sounds etc.  She has tried sleep hygiene. We will try trazodone at bedtime and notify of effectiveness in the next month or 2 Explained that insomnia has often very closely related to depression anxiety and cognitive behavioral therapy establishing with a therapist or counselor can be helpful for addressing insomnia and  with treating insomnia  - traZODone (DESYREL) 50 MG tablet; Take 0.5-1 tablets (25-50 mg total) by mouth at bedtime as needed for sleep.  Dispense: 30 tablet; Refill: 3  4. Vitamin D deficiency Last Vit D 10, not on supplement, has been lower in the past, recheck labs - VITAMIN D 25 Hydroxy (Vit-D Deficiency, Fractures)  5. Class 1 obesity due to excess calories without serious comorbidity with body mass index (BMI) of 34.0 to 34.9 in adult Increasing weight and BMI over the past year - COMPLETE METABOLIC PANEL WITH GFR - Lipid panel - VITAMIN D 25 Hydroxy (Vit-D Deficiency, Fractures) - CBC with Differential/Platelet - Comprehensive metabolic panel - TSH  6. Fatigue, unspecified type Mood slightly improved with addition and increase dose of buspar, but still very fatigued.  - COMPLETE METABOLIC PANEL WITH GFR - VITAMIN D 25 Hydroxy (Vit-D Deficiency, Fractures) - CBC with Differential/Platelet - TSH  7. Hyperlipidemia, unspecified hyperlipidemia type History of elevated cholesterol with increasing weights and BMI - COMPLETE METABOLIC PANEL WITH GFR - Lipid panel - Comprehensive metabolic panel  8. Leukocytosis, unspecified type Labs from 2019 and 2018 both show elevated white count we will recheck this - CBC with Differential/Platelet  9. Encounter for medication monitoring - COMPLETE METABOLIC PANEL WITH GFR - Lipid panel - VITAMIN D 25 Hydroxy (Vit-D Deficiency, Fractures) - CBC with Differential/Platelet - Comprehensive metabolic panel - TSH   Return for 6 month f/up anxiety/depression.   Delsa Grana, PA-C 04/07/20 8:16 AM

## 2020-04-08 LAB — TSH: TSH: 2.57 u[IU]/mL (ref 0.450–4.500)

## 2020-04-08 LAB — CBC WITH DIFFERENTIAL/PLATELET
Basophils Absolute: 0.1 10*3/uL (ref 0.0–0.2)
Basos: 1 %
EOS (ABSOLUTE): 0.3 10*3/uL (ref 0.0–0.4)
Eos: 4 %
Hematocrit: 41.3 % (ref 34.0–46.6)
Hemoglobin: 13.3 g/dL (ref 11.1–15.9)
Immature Grans (Abs): 0 10*3/uL (ref 0.0–0.1)
Immature Granulocytes: 0 %
Lymphocytes Absolute: 2.1 10*3/uL (ref 0.7–3.1)
Lymphs: 22 %
MCH: 28.5 pg (ref 26.6–33.0)
MCHC: 32.2 g/dL (ref 31.5–35.7)
MCV: 89 fL (ref 79–97)
Monocytes Absolute: 0.5 10*3/uL (ref 0.1–0.9)
Monocytes: 6 %
Neutrophils Absolute: 6.2 10*3/uL (ref 1.4–7.0)
Neutrophils: 67 %
Platelets: 299 10*3/uL (ref 150–450)
RBC: 4.66 x10E6/uL (ref 3.77–5.28)
RDW: 13.1 % (ref 11.7–15.4)
WBC: 9.3 10*3/uL (ref 3.4–10.8)

## 2020-04-08 LAB — COMPREHENSIVE METABOLIC PANEL
ALT: 14 IU/L (ref 0–32)
AST: 11 IU/L (ref 0–40)
Albumin/Globulin Ratio: 1.6 (ref 1.2–2.2)
Albumin: 4.4 g/dL (ref 3.8–4.8)
Alkaline Phosphatase: 98 IU/L (ref 48–121)
BUN/Creatinine Ratio: 9 (ref 9–23)
BUN: 7 mg/dL (ref 6–20)
Bilirubin Total: 0.5 mg/dL (ref 0.0–1.2)
CO2: 20 mmol/L (ref 20–29)
Calcium: 9.5 mg/dL (ref 8.7–10.2)
Chloride: 104 mmol/L (ref 96–106)
Creatinine, Ser: 0.79 mg/dL (ref 0.57–1.00)
GFR calc Af Amer: 109 mL/min/{1.73_m2} (ref >59)
GFR calc non Af Amer: 95 mL/min/{1.73_m2} (ref >59)
Globulin, Total: 2.7 g/dL (ref 1.5–4.5)
Glucose: 97 mg/dL (ref 65–99)
Potassium: 4.5 mmol/L (ref 3.5–5.2)
Sodium: 140 mmol/L (ref 134–144)
Total Protein: 7.1 g/dL (ref 6.0–8.5)

## 2020-04-08 LAB — LIPID PANEL
Chol/HDL Ratio: 4.6 ratio — ABNORMAL HIGH (ref 0.0–4.4)
Cholesterol, Total: 188 mg/dL (ref 100–199)
HDL: 41 mg/dL (ref >39)
LDL Chol Calc (NIH): 126 mg/dL — ABNORMAL HIGH (ref 0–99)
Triglycerides: 118 mg/dL (ref 0–149)
VLDL Cholesterol Cal: 21 mg/dL (ref 5–40)

## 2020-04-08 LAB — VITAMIN D 25 HYDROXY (VIT D DEFICIENCY, FRACTURES): Vit D, 25-Hydroxy: 15.4 ng/mL — ABNORMAL LOW (ref 30.0–100.0)

## 2020-04-10 NOTE — Progress Notes (Signed)
Patient is a 39 year old female here for follow-up of her bilateral breast reduction on 01/16/2020 with Dr. Marla Roe.  She is doing well today.  She has no complaints.  She reports improvement in neck and back pain.   Chaperone present on exam Incisions healing well, no wounds noted, NAC complexes with good color.  No erythema.  No drainage noted.  She has good symmetry.  Continue to wear sports bra, transition to normal bra 4 to 6 months. No sign of any infection, seroma, hematoma. Avoid bra with underwire. Discussed use of scar creams  Call with questions or concerns, follow-up in 1 year, call if needed to be seen sooner.

## 2020-04-11 ENCOUNTER — Other Ambulatory Visit: Payer: Self-pay

## 2020-04-11 ENCOUNTER — Encounter: Payer: Self-pay | Admitting: Surgical

## 2020-04-11 ENCOUNTER — Ambulatory Visit (INDEPENDENT_AMBULATORY_CARE_PROVIDER_SITE_OTHER): Payer: BC Managed Care – PPO | Admitting: Surgical

## 2020-04-11 VITALS — BP 119/84 | HR 71 | Temp 97.5°F

## 2020-04-11 DIAGNOSIS — Z9889 Other specified postprocedural states: Secondary | ICD-10-CM

## 2020-04-11 DIAGNOSIS — N62 Hypertrophy of breast: Secondary | ICD-10-CM

## 2020-07-07 ENCOUNTER — Other Ambulatory Visit: Payer: BC Managed Care – PPO

## 2020-07-07 ENCOUNTER — Other Ambulatory Visit: Payer: Self-pay | Admitting: *Deleted

## 2020-07-07 DIAGNOSIS — Z20822 Contact with and (suspected) exposure to covid-19: Secondary | ICD-10-CM | POA: Diagnosis not present

## 2020-07-08 LAB — NOVEL CORONAVIRUS, NAA: SARS-CoV-2, NAA: NOT DETECTED

## 2020-07-08 LAB — SARS-COV-2, NAA 2 DAY TAT

## 2020-08-04 NOTE — Telephone Encounter (Signed)
Patient is scheduled for 08/21/20 at 8:50 with [facility] for mirena placement.

## 2020-08-07 NOTE — Telephone Encounter (Signed)
Noted. Will order to arrive by apt date/time. 

## 2020-08-08 ENCOUNTER — Other Ambulatory Visit: Payer: Self-pay | Admitting: Family Medicine

## 2020-08-08 DIAGNOSIS — G4700 Insomnia, unspecified: Secondary | ICD-10-CM

## 2020-08-08 MED ORDER — TRAZODONE HCL 50 MG PO TABS: 25.0000 mg | ORAL_TABLET | Freq: Every evening | ORAL | 1 refills | Status: DC | PRN

## 2020-08-08 NOTE — Telephone Encounter (Signed)
Medication Refill - Medication: trazodone (90 day supply)  Has the patient contacted their pharmacy? Yes.   (Agent: If no, request that the patient contact the pharmacy for the refill.) (Agent: If yes, when and what did the pharmacy advise?)  Preferred Pharmacy (with phone number or street name): [facility] [place], [place]: Please be advised that RX refills may take up to 3 business days. We ask that you follow-up with your pharmacy.

## 2020-08-08 NOTE — Telephone Encounter (Signed)
Requested Prescriptions  Pending Prescriptions Disp Refills  . traZODone (DESYREL) 50 MG tablet 30 tablet 1    Sig: Take 0.5-1 tablets (25-50 mg total) by mouth at bedtime as needed for sleep.     Psychiatry: Antidepressants - Serotonin Modulator Passed - 08/08/2020  1:41 PM      Passed - Completed PHQ-2 or PHQ-9 in the last 360 days.      Passed - Valid encounter within last 6 months    Recent Outpatient Visits          4 months ago Current moderate episode of major depressive disorder without prior episode [facility])   [facility] [place], Kristeen Miss, PA-C   10 months ago Anxiety disorder, unspecified type   [place], Astrid Divine, FNP   1 year ago Class 1 obesity due to excess calories without serious comorbidity with body mass index (BMI) of 30.0 to 30.9 in adult   [place], FNP   1 year ago Current moderate episode of major depressive disorder without prior episode [facility])   [place], FNP   1 year ago Anxiety disorder, unspecified type   [place], FNP      Future Appointments            In 2 months Delsa Grana, PA-C [facility], [facility]

## 2020-08-20 NOTE — Telephone Encounter (Signed)
Appointment has been cancelled.

## 2020-08-20 NOTE — Telephone Encounter (Signed)
Per [facility] called pt to advise Mirena is good for 7 yrs now. Pt had IUD placed 08/2014. She can wait another yr or have it replaced tomorrow. Pt will wait another yr. Please cancel pts appt for tomorrow at 850.

## 2020-08-20 NOTE — Progress Notes (Deleted)
   No chief complaint on file.    History of Present Illness:  Laura Watson is a 39 y.o. that had a {IUD:23561} IUD placed approximately {NUMBERS:20191} {MONTH/YR:310907} ago. Since that time, she denies dyspareunia, pelvic pain, non-menstrual bleeding, vaginal d/c, heavy bleeding.    There were no vitals taken for this visit.  Pelvic exam:  Two IUD strings {DESC; PRESENT/ABSENT:17923::"present"} seen coming from the cervical os. EGBUS, vaginal vault and cervix: within normal limits  IUD Removal Strings of IUD identified and grasped.  IUD removed without problem with ring forceps.  Pt tolerated this well.  IUD noted to be intact.  Assessment:  No diagnosis found.    Plan: IUD removed and plan for contraception is {PLAN CONTRACEPTION:313102}. She was amenable to this plan.  Jessly Lebeck B. Maycol Hoying, PA-C 08/20/2020 11:20 AM   No chief complaint on file.    IUD PROCEDURE NOTE:  Laura Watson is a 39 y.o. [phone] here for {type:23561}  IUD insertion for ***   There were no vitals taken for this visit.  IUD Insertion Procedure Note Patient identified, informed consent performed, consent signed.   Discussed risks of irregular bleeding, cramping, infection, malpositioning or misplacement of the IUD outside the uterus which may require further procedure such as laparoscopy, risk of failure <1%. Time out was performed.  Urine pregnancy test negative.***  Speculum placed in the vagina.  Cervix visualized.  Cleaned with Betadine x 2.  Grasped anteriorly with a single tooth tenaculum.  Uterus sounded to *** cm.   IUD placed per manufacturer's recommendations.  Strings trimmed to 3 cm. Tenaculum was removed, good hemostasis noted.  Patient tolerated procedure well.   ASSESSMENT:  No diagnosis found.   No orders of the defined types were placed in this encounter.    Plan:  Patient was given post-procedure instructions.  She was advised to have backup contraception for one  week.   Call if you are having increasing pain, cramps or bleeding or if you have a fever greater than 100.4 degrees F., shaking chills, nausea or vomiting. Patient was also asked to check IUD strings periodically and follow up in 4 weeks for IUD check.  No follow-ups on file.  Arvon Schreiner B. Mariame Rybolt, PA-C 08/20/2020 11:20 AM

## 2020-08-21 ENCOUNTER — Ambulatory Visit: Payer: Self-pay | Admitting: Obstetrics and Gynecology

## 2020-10-07 ENCOUNTER — Other Ambulatory Visit: Payer: Self-pay

## 2020-10-07 ENCOUNTER — Ambulatory Visit: Payer: BC Managed Care – PPO | Admitting: Family Medicine

## 2020-10-07 ENCOUNTER — Encounter: Payer: Self-pay | Admitting: Family Medicine

## 2020-10-07 VITALS — BP 120/72 | HR 98 | Temp 98.1°F | Resp 16 | Ht 64.0 in | Wt 200.3 lb

## 2020-10-07 DIAGNOSIS — R21 Rash and other nonspecific skin eruption: Secondary | ICD-10-CM

## 2020-10-07 DIAGNOSIS — Z6834 Body mass index (BMI) 34.0-34.9, adult: Secondary | ICD-10-CM

## 2020-10-07 DIAGNOSIS — F419 Anxiety disorder, unspecified: Secondary | ICD-10-CM

## 2020-10-07 DIAGNOSIS — G4700 Insomnia, unspecified: Secondary | ICD-10-CM | POA: Diagnosis not present

## 2020-10-07 DIAGNOSIS — Z23 Encounter for immunization: Secondary | ICD-10-CM

## 2020-10-07 DIAGNOSIS — E559 Vitamin D deficiency, unspecified: Secondary | ICD-10-CM

## 2020-10-07 DIAGNOSIS — E6609 Other obesity due to excess calories: Secondary | ICD-10-CM

## 2020-10-07 DIAGNOSIS — Z1231 Encounter for screening mammogram for malignant neoplasm of breast: Secondary | ICD-10-CM

## 2020-10-07 DIAGNOSIS — F321 Major depressive disorder, single episode, moderate: Secondary | ICD-10-CM

## 2020-10-07 DIAGNOSIS — K219 Gastro-esophageal reflux disease without esophagitis: Secondary | ICD-10-CM

## 2020-10-07 DIAGNOSIS — J309 Allergic rhinitis, unspecified: Secondary | ICD-10-CM

## 2020-10-07 MED ORDER — FLUTICASONE PROPIONATE 50 MCG/ACT NA SUSP: 2.0000 | Freq: Every day | NASAL | 5 refills | Status: DC

## 2020-10-07 MED ORDER — CLOBETASOL PROPIONATE 0.05 % EX OINT: 1.0000 "application " | TOPICAL_OINTMENT | Freq: Two times a day (BID) | CUTANEOUS | 1 refills | Status: DC

## 2020-10-07 MED ORDER — LEVOCETIRIZINE DIHYDROCHLORIDE 5 MG PO TABS: 5.0000 mg | ORAL_TABLET | Freq: Every evening | ORAL | 3 refills | Status: DC

## 2020-10-07 MED ORDER — PANTOPRAZOLE SODIUM 40 MG PO TBEC: 40.0000 mg | DELAYED_RELEASE_TABLET | Freq: Every day | ORAL | 0 refills | Status: DC

## 2020-10-07 MED ORDER — TRAZODONE HCL 50 MG PO TABS: 50.0000 mg | ORAL_TABLET | Freq: Every evening | ORAL | 3 refills | Status: DC | PRN

## 2020-10-07 MED ORDER — VITAMIN D (ERGOCALCIFEROL) 1.25 MG (50000 UNIT) PO CAPS: 50000.0000 [IU] | ORAL_CAPSULE | ORAL | 0 refills | Status: DC

## 2020-10-07 NOTE — Patient Instructions (Addendum)
Risks of PPI use were discussed with patient including bone loss, C. Diff diarrhea, pneumonia, infections, CKD, electrolyte abnormalities.  Pt. Verbalizes understanding and chooses to continue the medication Protonix daily in the morning for 2-4 weeks, then try and wean off by adding pepcid or zantac twice a day as needed.      Vit D - take prescription and then when done take 1000-2000 IU daily supplement Vit D 3   Food Choices for Gastroesophageal Reflux Disease, Adult When you have gastroesophageal reflux disease (GERD), the foods you eat and your eating habits are very important. Choosing the right foods can help ease your discomfort. Think about working with a nutrition specialist (dietitian) to help you make good choices. What are tips for following this plan?  Meals  Choose healthy foods that are low in fat, such as fruits, vegetables, whole grains, low-fat dairy products, and lean meat, fish, and poultry.  Eat small meals often instead of 3 large meals a day. Eat your meals slowly, and in a place where you are relaxed. Avoid bending over or lying down until 2-3 hours after eating.  Avoid eating meals 2-3 hours before bed.  Avoid drinking a lot of liquid with meals.  Cook foods using methods other than frying. Bake, grill, or broil food instead.  Avoid or limit: ? Chocolate. ? Peppermint or spearmint. ? Alcohol. ? Pepper. ? Black and decaffeinated coffee. ? Black and decaffeinated tea. ? Bubbly (carbonated) soft drinks. ? Caffeinated energy drinks and soft drinks.  Limit high-fat foods such as: ? Fatty meat or fried foods. ? Whole milk, cream, butter, or ice cream. ? Nuts and nut butters. ? Pastries, donuts, and sweets made with butter or shortening.  Avoid foods that cause symptoms. These foods may be different for everyone. Common foods that cause symptoms include: ? Tomatoes. ? Oranges, lemons, and limes. ? Peppers. ? Spicy food. ? Onions and  garlic. ? Vinegar. Lifestyle  Maintain a healthy weight. Ask your doctor what weight is healthy for you. If you need to lose weight, work with your doctor to do so safely.  Exercise for at least 30 minutes for 5 or more days each week, or as told by your doctor.  Wear loose-fitting clothes.  Do not smoke. If you need help quitting, ask your doctor.  Sleep with the head of your bed higher than your feet. Use a wedge under the mattress or blocks under the bed frame to raise the head of the bed. Summary  When you have gastroesophageal reflux disease (GERD), food and lifestyle choices are very important in easing your symptoms.  Eat small meals often instead of 3 large meals a day. Eat your meals slowly, and in a place where you are relaxed.  Limit high-fat foods such as fatty meat or fried foods.  Avoid bending over or lying down until 2-3 hours after eating.  Avoid peppermint and spearmint, caffeine, alcohol, and chocolate. This information is not intended to replace advice given to you by your health care provider. Make sure you discuss any questions you have with your health care provider. Document Revised: 02/01/2019 Document Reviewed: 11/16/2016 Elsevier Patient Education  [place].

## 2020-10-07 NOTE — Progress Notes (Signed)
Name: Laura Watson   MRN: [id number]    DOB: [birth year]   Date:10/07/2020       Progress Note  Chief Complaint  Patient presents with  . Depression  . Anxiety    6 month recheck     Subjective:   Laura Watson is a 39 y.o. female, presents to clinic for f/up  Anxiety and depression Depression screen [facility] 2/9 10/07/2020 04/07/2020 10/11/2019 05/18/2019 02/16/2019  Decreased Interest 0 0 0 0 0  Down, Depressed, Hopeless 0 0 0 0 0  PHQ - 2 Score 0 0 0 0 0  Altered sleeping 1 2 3 1 2   Tired, decreased energy 1 3 1 1 1   Change in appetite 1 0 0 0 0  Feeling bad or failure about yourself  1 0 0 0 0  Trouble concentrating 1 0 3 2 1   Moving slowly or fidgety/restless 0 0 0 1 0  Suicidal thoughts 0 0 0 0 0  PHQ-9 Score 5 5 7 5 4   Difficult doing work/chores Somewhat difficult Somewhat difficult Somewhat difficult Somewhat difficult Somewhat difficult   GAD 7 : Generalized Anxiety Score 10/07/2020 04/07/2020 01/16/2018 01/16/2018  Nervous, Anxious, on Edge 0 1 3 3   Control/stop worrying 0 0 2 2  Worry too much - different things 0 0 2 2  Trouble relaxing 1 0 3 3  Restless 0 1 3 3   Easily annoyed or irritable 1 1 3 3   Afraid - awful might happen 0 0 2 2  Total GAD 7 Score 2 3 18 18   Anxiety Difficulty Somewhat difficult Not difficult at all Very difficult Very difficult  Patient has been managing with 20 mg Lexapro, uses trazodone at night for sleep, and uses 7.5 to 50 mg BuSpar twice daily using pretty regularly Overall she feels her anxiety and depression have improved somewhat Offered to change the dosing of the BuSpar but she is happy with how it currently is.  Rash to right lower leg a large patch has been there for many years she has seen dermatology for this in the past was given a cream, and never has gone completely away, slightly raised thickened and erythematous without any pain or itching.  She does not member who she went to before or what the medication was she is  currently only moisturizing the area, it does not seem to have changed at all in size and she has no similar rash to other places of her body, no past biopsy  New daily bothersome GERD:  tums every day, reflux, feels in chest, all the time, she has had in the past, she's tried zantac OTC helps a little bit mostly indigestion and reflux, no abdominal pain, change in bowel movements, bloating, nausea or vomiting  allergies -no recent ear symptoms or complaints, she continues to use Flonase but did not like Astelin, she does take Xyzal does want a refill at this time  Vit D deficiency -vitamin D was 15.4 at last office visit    Current Outpatient Medications:  .  acetaminophen (TYLENOL) 325 MG tablet, Take 650 mg by mouth as needed., Disp: , Rfl:  .  busPIRone (BUSPAR) 15 MG tablet, Take 0.5-1 tablets (7.5-15 mg total) by mouth 2 (two) times daily as needed., Disp: 180 tablet, Rfl: 3 .  escitalopram (LEXAPRO) 20 MG tablet, Take 1 tablet (20 mg total) by mouth daily., Disp: 90 tablet, Rfl: 3 .  ibuprofen (ADVIL,MOTRIN) 200 MG tablet, Take 400 mg by  mouth as needed., Disp: , Rfl:  .  levocetirizine (XYZAL) 5 MG tablet, Take 1 tablet (5 mg total) by mouth every evening., Disp: 90 tablet, Rfl: 1 .  levonorgestrel (MIRENA) 20 MCG/24HR IUD, 1 Intra Uterine Device by Intrauterine route continuous., Disp: , Rfl:  .  traZODone (DESYREL) 50 MG tablet, Take 0.5-1 tablets (25-50 mg total) by mouth at bedtime as needed for sleep., Disp: 30 tablet, Rfl: 1 .  azelastine (ASTELIN) 0.1 % nasal spray, Place 2 sprays into both nostrils 2 (two) times daily. Use in each nostril as directed (Patient not taking: Reported on 10/07/2020), Disp: 30 mL, Rfl: 12 .  fluticasone (FLONASE) 50 MCG/ACT nasal spray, Place 2 sprays into both nostrils daily. (Patient not taking: Reported on 10/07/2020), Disp: 16 g, Rfl: 6  Patient Active Problem List   Diagnosis Date Noted  . Mass of right side of neck 10/11/2019  . Back pain  08/14/2019  . Neck pain 08/14/2019  . Symptomatic mammary hypertrophy 08/14/2019  . Anxiety disorder 01/16/2018  . Current moderate episode of major depressive disorder without prior episode ([place]) 01/16/2018  . Class 1 obesity due to excess calories without serious comorbidity with body mass index (BMI) of 30.0 to 30.9 in adult 01/16/2018  . Family history of diabetes mellitus 01/16/2018  . Fatigue 01/16/2018  . History of vitamin D deficiency 01/16/2018    Past Surgical History:  Procedure Laterality Date  . APPENDECTOMY  2009  . BREAST REDUCTION SURGERY Bilateral 01/16/2020   Procedure: MAMMARY REDUCTION  (BREAST);  Surgeon: Wallace Going, DO;  Location: [place];  Service: Plastics;  Laterality: Bilateral;  3.5 hours  . CESAREAN SECTION     x2    Family History  Problem Relation Age of Onset  . Diabetes Mother   . Cancer Mother 49       lymphoma  . Diabetes Father   . Pulmonary fibrosis Father   . Diabetes Sister   . Diabetes Brother   . Dementia Maternal Grandmother   . Alzheimer's disease Maternal Grandfather   . Breast cancer Neg Hx     Social History   Tobacco Use  . Smoking status: Never Smoker  . Smokeless tobacco: Never Used  Vaping Use  . Vaping Use: Never used  Substance Use Topics  . Alcohol use: Yes    Comment: occ  . Drug use: No     No Known Allergies  Health Maintenance  Topic Date Due  . Hepatitis C Screening  04/07/2021 (Originally September 11, 1981)  . PAP SMEAR-Modifier  12/06/2021  . TETANUS/TDAP  01/17/2029  . INFLUENZA VACCINE  Completed  . COVID-19 Vaccine  Completed  . HIV Screening  Completed    Chart Review Today: I personally reviewed active problem list, medication list, allergies, family history, social history, health maintenance, notes from last encounter, lab results, imaging with the patient/caregiver today.   Review of Systems  10 Systems reviewed and are negative for acute change except as noted in the  HPI.  Objective:   Vitals:   10/07/20 0805  BP: 120/72  Pulse: 98  Resp: 16  Temp: 98.1 F (36.7 C)  TempSrc: Oral  SpO2: 96%  Weight: 200 lb 4.8 oz (90.9 kg)  Height: 5\' 4"  (1.626 m)    Body mass index is 34.38 kg/m.  Physical Exam Vitals and nursing note reviewed.  Constitutional:      General: She is not in acute distress.    Appearance: Normal appearance. She is  well-developed. She is not ill-appearing, toxic-appearing or diaphoretic.     Interventions: Face mask in place.  HENT:     Head: Normocephalic and atraumatic.     Right Ear: External ear normal.     Left Ear: External ear normal.  Eyes:     General: Lids are normal. No scleral icterus.       Right eye: No discharge.        Left eye: No discharge.     Conjunctiva/sclera: Conjunctivae normal.  Neck:     Trachea: Phonation normal. No tracheal deviation.  Cardiovascular:     Rate and Rhythm: Normal rate and regular rhythm.     Pulses: Normal pulses.          Radial pulses are 2+ on the right side and 2+ on the left side.     Heart sounds: Normal heart sounds. No murmur heard. No friction rub. No gallop.   Pulmonary:     Effort: Pulmonary effort is normal. No respiratory distress.     Breath sounds: Normal breath sounds. No stridor. No wheezing, rhonchi or rales.  Chest:     Chest wall: No tenderness.  Abdominal:     General: Bowel sounds are normal. There is no distension.     Palpations: Abdomen is soft.     Tenderness: There is no abdominal tenderness. There is no right CVA tenderness or left CVA tenderness.  Musculoskeletal:     Right lower leg: No edema.     Left lower leg: No edema.  Skin:    General: Skin is warm and dry.     Coloration: Skin is not jaundiced or pale.     Findings: No rash.     Comments: 5cm wide by 8 cm tall slightly raised erythematous skin lesion with irregular borders no central clearing, no current plaques, scaling or peeling Fair freckly skin Nails normal-appearing   Neurological:     Mental Status: She is alert.     Motor: No abnormal muscle tone.     Gait: Gait normal.  Psychiatric:        Mood and Affect: Mood normal.        Speech: Speech normal.        Behavior: Behavior normal.        5x8 cm   Assessment & Plan:   1. Current moderate episode of major depressive disorder without prior episode ([place]) PHQ reviewed today still positive but overall score improving, continue Lexapro 20 mg, BuSpar as needed Therapist and/or psychiatry still recommended to help with further evaluation and management, therapy recommended  2. Anxiety disorder, unspecified type GAD-7 reviewed improved score, offered to adjust her BuSpar medication but she is doing well with current pills and dosing most days taking 7.5 mg BuSpar twice daily and only rarely taking 50 mg twice daily  3. Gastroesophageal reflux disease, unspecified whether esophagitis present No reflux symptoms, no abdominal tenderness on exam, discussed PPI medications, side effects, and treatment for 2 to 4 weeks with recommendation to wean off if able with the use of Pepcid or Zantac, diet and lifestyle recommendations also reviewed - pantoprazole (PROTONIX) 40 MG tablet; Take 1 tablet (40 mg total) by mouth daily.  Dispense: 90 tablet; Refill: 0 - H. pylori breath test  4. Insomnia, unspecified type Some improvement in her sleep with trazodone, refills needed sent through 90-day supply  - traZODone (DESYREL) 50 MG tablet; Take 1 tablet (50 mg total) by mouth at bedtime as needed for  sleep.  Dispense: 90 tablet; Refill: 3  5. Allergic rhinitis, unspecified seasonality, unspecified trigger No current bothersome ear symptoms, eustachian tube dysfunction has improved, she continues to manage seasonal allergies with Flonase and Xyzal, did not like Astelin nasal spray - levocetirizine (XYZAL) 5 MG tablet; Take 1 tablet (5 mg total) by mouth every evening.  Dispense: 90 tablet; Refill: 3 - fluticasone  (FLONASE) 50 MCG/ACT nasal spray; Place 2 sprays into both nostrils daily.  Dispense: 16 g; Refill: 5  6. Rash and nonspecific skin eruption Longstanding ongoing for many years no significant change to size or severity, she previously consulted dermatology for evaluation but she cannot recall what the treatment was or what dermatologist she saw, she has been doing over-the-counter medications and moisturizers, trial of higher dose steroid only 2 raised thickened skin area, no other elsewhere on body that are similar and no associated symptoms. She reports that it does seem to get a bit better when exposed to sunlight, could be psoriasis ?  Explained that she could try higher potency steroid topically twice daily and decrease use in 1 to 2 weeks or as it is there is improvement and can switch to cortisone 10 over-the-counter, may need a biopsy to get a definitive diagnosis - clobetasol ointment (TEMOVATE) 0.05 %; Apply 1 application topically 2 (two) times daily.  Dispense: 60 g; Refill: 1 - Ambulatory referral to Dermatology  7. Encounter for screening mammogram for malignant neoplasm of breast - MM 3D SCREEN BREAST BILATERAL; Future  8. Vitamin D deficiency Vitamin D level was 15.4, do weekly higher dose supplement and then transition to weekly 1000-2000 IU vitamin D3 supplement - Vitamin D, Ergocalciferol, (DRISDOL) 1.25 MG (50000 UNIT) CAPS capsule; Take 1 capsule (50,000 Units total) by mouth every 7 (seven) days. x12 weeks.  Dispense: 12 capsule; Refill: 0  9. Class 1 obesity due to excess calories without serious comorbidity with body mass index (BMI) of 34.0 to 34.9 in adult Weight stable, no change since her physical  10. Need for influenza vaccination - Flu Vaccine QUAD 6+ mos PF IM (Fluarix Quad PF)    Return in about 6 months (around 04/07/2021) for Routine follow-up.   Delsa Grana, PA-C 10/07/20 8:23 AM

## 2020-12-21 ENCOUNTER — Other Ambulatory Visit: Payer: Self-pay | Admitting: Family Medicine

## 2020-12-21 DIAGNOSIS — E559 Vitamin D deficiency, unspecified: Secondary | ICD-10-CM

## 2020-12-21 DIAGNOSIS — K219 Gastro-esophageal reflux disease without esophagitis: Secondary | ICD-10-CM

## 2021-02-09 ENCOUNTER — Ambulatory Visit: Admit: 2021-02-09 | Discharge status: Against Medical Advice | Payer: BC Managed Care – PPO

## 2021-02-10 ENCOUNTER — Encounter: Payer: Self-pay | Admitting: Family Medicine

## 2021-02-10 ENCOUNTER — Ambulatory Visit: Payer: BC Managed Care – PPO | Admitting: Family Medicine

## 2021-02-10 ENCOUNTER — Other Ambulatory Visit: Payer: Self-pay

## 2021-02-10 ENCOUNTER — Ambulatory Visit
Admission: RE | Admit: 2021-02-10 | Discharge: 2021-02-10 | Disposition: A | Discharge status: Home / Self Care | Payer: BC Managed Care – PPO | Source: Ambulatory Visit | Attending: Family Medicine | Admitting: Family Medicine

## 2021-02-10 VITALS — BP 130/70 | HR 91 | Temp 98.1°F | Resp 14 | Ht 64.0 in | Wt 197.1 lb

## 2021-02-10 DIAGNOSIS — R1011 Right upper quadrant pain: Secondary | ICD-10-CM

## 2021-02-10 DIAGNOSIS — K219 Gastro-esophageal reflux disease without esophagitis: Secondary | ICD-10-CM | POA: Diagnosis not present

## 2021-02-10 DIAGNOSIS — Z6834 Body mass index (BMI) 34.0-34.9, adult: Secondary | ICD-10-CM

## 2021-02-10 DIAGNOSIS — E559 Vitamin D deficiency, unspecified: Secondary | ICD-10-CM

## 2021-02-10 DIAGNOSIS — Z0000 Encounter for general adult medical examination without abnormal findings: Secondary | ICD-10-CM

## 2021-02-10 DIAGNOSIS — R232 Flushing: Secondary | ICD-10-CM

## 2021-02-10 DIAGNOSIS — E6609 Other obesity due to excess calories: Secondary | ICD-10-CM

## 2021-02-10 MED ORDER — PANTOPRAZOLE SODIUM 40 MG PO TBEC: DELAYED_RELEASE_TABLET | ORAL | 0 refills | Status: DC

## 2021-02-10 NOTE — Progress Notes (Signed)
Patient ID: Laura Watson, female    DOB: 12/23/1980, 40 y.o.   MRN: [id number]  PCP: Delsa Grana, PA-C  Chief Complaint  Patient presents with  . Abdominal Pain    Constant stomach pains, Over 1 week. If move certain feel like its burning.    Subjective:   Laura AMBROCIO is a 40 y.o. female, presents to clinic with CC of the following:  Certain movements exacerbated pain, not changed with eating or time of day She feels pressure and bloated around RUQ and generalized gassiness, pain is focused RUQ, under right anterior lower ribs and a little to epigastric area, no other radiation, may feel a little better if she leans forward.  Onset of pain in the past 1-3 weeks, last week severe, especially over the weekend 3-4 d ago, but calmed down a little today.  No associated fever, chills, sweats, diarrhea/constipation/bowel changes, urinary sx, vaginal sx. Hx of appendectomy GERD started about last oct, at her OV Dec 2021 we started protonix, however she ran out of meds about a month ago.  Ran out of protonix prior to pain beginning.   Denies melena, hematochezia, weight changes     Abdominal Pain This is a new problem. The current episode started in the past 7 days. The problem occurs intermittently. The problem has been waxing and waning. The pain is located in the RUQ. The pain is at a severity of 8/10. The quality of the pain is burning, aching, sharp and tearing (waves of pain). The abdominal pain does not radiate. Associated symptoms include belching and flatus. Pertinent negatives include no anorexia, arthralgias, constipation, diarrhea, dysuria, fever, frequency, headaches, hematochezia, hematuria, melena, myalgias, nausea, vomiting or weight loss. The pain is aggravated by certain positions and movement. The pain is relieved by certain positions (leaning forward). She has tried nothing for the symptoms. The treatment provided no relief. Prior diagnostic workup includes surgery.  Her past medical history is significant for abdominal surgery (appendectomy and C-section x 2 17 years ago) and GERD. There is no history of colon cancer, Crohn's disease, gallstones, irritable bowel syndrome, pancreatitis, PUD or ulcerative colitis.    She notes hot flashes usually at night over the past 6 months, on depo shot no change to menses  Vit D deficiency, she finished Rx supplement  Fatigue generally getter    Patient Active Problem List   Diagnosis Date Noted  . Mass of right side of neck 10/11/2019  . Back pain 08/14/2019  . Neck pain 08/14/2019  . Symptomatic mammary hypertrophy 08/14/2019  . Anxiety disorder 01/16/2018  . Current moderate episode of major depressive disorder without prior episode ([place]) 01/16/2018  . Class 1 obesity due to excess calories without serious comorbidity with body mass index (BMI) of 30.0 to 30.9 in adult 01/16/2018  . Family history of diabetes mellitus 01/16/2018  . Fatigue 01/16/2018  . History of vitamin D deficiency 01/16/2018      Current Outpatient Medications:  .  acetaminophen (TYLENOL) 325 MG tablet, Take 650 mg by mouth as needed., Disp: , Rfl:  .  busPIRone (BUSPAR) 15 MG tablet, Take 0.5-1 tablets (7.5-15 mg total) by mouth 2 (two) times daily as needed., Disp: 180 tablet, Rfl: 3 .  clobetasol ointment (TEMOVATE) 4.58 %, Apply 1 application topically 2 (two) times daily., Disp: 60 g, Rfl: 1 .  escitalopram (LEXAPRO) 20 MG tablet, Take 1 tablet (20 mg total) by mouth daily., Disp: 90 tablet, Rfl: 3 .  fluticasone (FLONASE)  50 MCG/ACT nasal spray, Place 2 sprays into both nostrils daily., Disp: 16 g, Rfl: 5 .  ibuprofen (ADVIL,MOTRIN) 200 MG tablet, Take 400 mg by mouth as needed., Disp: , Rfl:  .  levocetirizine (XYZAL) 5 MG tablet, Take 1 tablet (5 mg total) by mouth every evening., Disp: 90 tablet, Rfl: 3 .  levonorgestrel (MIRENA) 20 MCG/24HR IUD, 1 Intra Uterine Device by Intrauterine route continuous., Disp: , Rfl:  .   traZODone (DESYREL) 50 MG tablet, Take 1 tablet (50 mg total) by mouth at bedtime as needed for sleep., Disp: 90 tablet, Rfl: 3 .  Vitamin D, Ergocalciferol, (DRISDOL) 1.25 MG (50000 UNIT) CAPS capsule, Take 1 capsule (50,000 Units total) by mouth every 7 (seven) days. x12 weeks., Disp: 12 capsule, Rfl: 0 .  pantoprazole (PROTONIX) 40 MG tablet, Take 1 tablet (40 mg total) by mouth daily. (Patient not taking: Reported on 02/10/2021), Disp: 90 tablet, Rfl: 0   No Known Allergies   Social History   Tobacco Use  . Smoking status: Never Smoker  . Smokeless tobacco: Never Used  Vaping Use  . Vaping Use: Never used  Substance Use Topics  . Alcohol use: Yes    Comment: occ  . Drug use: No      Chart Review Today: I personally reviewed active problem list, medication list, allergies, family history, social history, health maintenance, notes from last encounter, lab results, imaging with the patient/caregiver today.   Review of Systems  Constitutional: Negative.  Negative for fever and weight loss.  HENT: Negative.   Eyes: Negative.   Respiratory: Negative.   Cardiovascular: Negative.   Gastrointestinal: Positive for abdominal pain and flatus. Negative for anorexia, constipation, diarrhea, hematochezia, melena, nausea and vomiting.  Endocrine: Negative.   Genitourinary: Negative.  Negative for dysuria, frequency and hematuria.  Musculoskeletal: Negative.  Negative for arthralgias and myalgias.  Skin: Negative.   Allergic/Immunologic: Negative.   Neurological: Negative.  Negative for headaches.  Hematological: Negative.   Psychiatric/Behavioral: Negative.   All other systems reviewed and are negative.      Objective:   Vitals:   02/10/21 1431  BP: 130/70  Pulse: 91  Resp: 14  Temp: 98.1 F (36.7 C)  SpO2: 98%  Weight: 197 lb 1.6 oz (89.4 kg)  Height: 5\' 4"  (1.626 m)    Body mass index is 33.83 kg/m.  Physical Exam Vitals and nursing note reviewed.  Constitutional:       General: She is not in acute distress.    Appearance: Normal appearance. She is well-developed. She is obese. She is not ill-appearing, toxic-appearing or diaphoretic.  HENT:     Head: Normocephalic and atraumatic.     Right Ear: External ear normal.     Left Ear: External ear normal.     Nose: Nose normal.     Mouth/Throat:     Pharynx: Uvula midline.  Eyes:     General: Lids are normal.     Conjunctiva/sclera: Conjunctivae normal.     Pupils: Pupils are equal, round, and reactive to light.  Neck:     Trachea: Phonation normal. No tracheal deviation.  Cardiovascular:     Rate and Rhythm: Normal rate and regular rhythm.     Pulses: Normal pulses.          Radial pulses are 2+ on the right side and 2+ on the left side.       Posterior tibial pulses are 2+ on the right side and 2+ on the left side.  Heart sounds: Normal heart sounds. No murmur heard. No friction rub. No gallop.   Pulmonary:     Effort: Pulmonary effort is normal. No respiratory distress.     Breath sounds: Normal breath sounds. No stridor. No wheezing, rhonchi or rales.  Chest:     Chest wall: No mass or tenderness (no chest wall or rib ttp).  Abdominal:     General: Abdomen is protuberant. Bowel sounds are normal. There is no distension.     Palpations: Abdomen is soft. There is no hepatomegaly, splenomegaly, mass or pulsatile mass.     Tenderness: There is abdominal tenderness in the right upper quadrant and epigastric area. There is guarding (voluntary ). There is no right CVA tenderness, left CVA tenderness or rebound. Positive signs include Murphy's sign. Negative signs include McBurney's sign.     Hernia: No hernia is present.  Musculoskeletal:        General: No deformity. Normal range of motion.     Cervical back: Normal range of motion and neck supple.  Lymphadenopathy:     Cervical: No cervical adenopathy.  Skin:    General: Skin is warm and dry.     Coloration: Skin is not jaundiced or pale.      Findings: No rash.  Neurological:     Mental Status: She is alert.     Motor: No abnormal muscle tone.     Gait: Gait normal.  Psychiatric:        Mood and Affect: Mood normal.        Speech: Speech normal.        Behavior: Behavior normal.      Results for orders placed or performed in visit on 07/07/20  Novel Coronavirus, NAA ([facility])   Specimen: Nasopharyngeal(NP) swabs in vial transport medium   Nasopharynge  Screenin  Result Value Ref Range   SARS-CoV-2, NAA Not Detected Not Detected  SARS-COV-2, NAA 2 DAY TAT   Nasopharynge  Screenin  Result Value Ref Range   SARS-CoV-2, NAA 2 DAY TAT Performed        Assessment & Plan:      ICD-10-CM   1. Right upper quadrant abdominal pain  R10.11 CBC with Differential/Platelet    COMPLETE METABOLIC PANEL WITH GFR    Lipase    US ABDOMEN LIMITED RUQ (LIVER/GB)    pantoprazole (PROTONIX) 40 MG tablet   r/o cholecystitis, may be GERD or PUD?  restart PPI, RUQ [place], complete h. pylori breath test and check basic labs  2. Gastroesophageal reflux disease, unspecified whether esophagitis present  K21.9 pantoprazole (PROTONIX) 40 MG tablet   abd sx worsened after she was out of protonix which she started 4 months ago, she did not f/up, GERD sx were new in the past 6 months, may need GI f/up  3. Vitamin D deficiency  Z12.4 COMPLETE METABOLIC PANEL WITH GFR    VITAMIN D 25 Hydroxy (Vit-D Deficiency, Fractures)  4. Class 1 obesity due to excess calories without serious comorbidity with body mass index (BMI) of 34.0 to 34.9 in adult  E66.09    Z68.34   5. Hot flashes  R23.2 CBC with Differential/Platelet    COMPLETE METABOLIC PANEL WITH GFR    TSH   check TSH, may be perimenopausal sx?  6. Adult general medical exam  Z00.00 CBC with Differential/Platelet    COMPLETE METABOLIC PANEL WITH GFR    Lipid panel   labs will be due with CPE in 1-2 months, with labs needed today  for acute issue, will obtain CPE labs as well, and CPE will be done  in June       Delsa Grana, PA-C 02/10/21 2:43 PM

## 2021-02-11 ENCOUNTER — Other Ambulatory Visit: Payer: Self-pay

## 2021-02-11 DIAGNOSIS — K219 Gastro-esophageal reflux disease without esophagitis: Secondary | ICD-10-CM

## 2021-02-11 DIAGNOSIS — R1011 Right upper quadrant pain: Secondary | ICD-10-CM

## 2021-02-11 LAB — CBC WITH DIFFERENTIAL/PLATELET
Absolute Monocytes: 659 cells/uL (ref 200–950)
Basophils Absolute: 97 cells/uL (ref 0–200)
Basophils Relative: 0.9 %
Eosinophils Absolute: 335 cells/uL (ref 15–500)
Eosinophils Relative: 3.1 %
HCT: 40.7 % (ref 35.0–45.0)
Hemoglobin: 13.6 g/dL (ref 11.7–15.5)
Lymphs Abs: 2992 cells/uL (ref 850–3900)
MCH: 30 pg (ref 27.0–33.0)
MCHC: 33.4 g/dL (ref 32.0–36.0)
MCV: 89.8 fL (ref 80.0–100.0)
MPV: 10.2 fL (ref 7.5–12.5)
Monocytes Relative: 6.1 %
Neutro Abs: 6718 cells/uL (ref 1500–7800)
Neutrophils Relative %: 62.2 %
Platelets: 425 10*3/uL — ABNORMAL HIGH (ref 140–400)
RBC: 4.53 10*6/uL (ref 3.80–5.10)
RDW: 12.8 % (ref 11.0–15.0)
Total Lymphocyte: 27.7 %
WBC: 10.8 10*3/uL (ref 3.8–10.8)

## 2021-02-11 LAB — LIPID PANEL
Cholesterol: 188 mg/dL (ref <200)
HDL: 43 mg/dL — ABNORMAL LOW (ref >=50)
LDL Cholesterol (Calc): 116 mg/dL (calc) — ABNORMAL HIGH
Non-HDL Cholesterol (Calc): 145 mg/dL (calc) — ABNORMAL HIGH (ref <130)
Total CHOL/HDL Ratio: 4.4 (calc) (ref <5.0)
Triglycerides: 175 mg/dL — ABNORMAL HIGH (ref <150)

## 2021-02-11 LAB — COMPLETE METABOLIC PANEL WITH GFR
AG Ratio: 1.5 (calc) (ref 1.0–2.5)
ALT: 11 U/L (ref 6–29)
AST: 12 U/L (ref 10–30)
Albumin: 4.3 g/dL (ref 3.6–5.1)
Alkaline phosphatase (APISO): 85 U/L (ref 31–125)
BUN: 10 mg/dL (ref 7–25)
CO2: 25 mmol/L (ref 20–32)
Calcium: 9.6 mg/dL (ref 8.6–10.2)
Chloride: 106 mmol/L (ref 98–110)
Creat: 0.8 mg/dL (ref 0.50–1.10)
GFR, Est African American: 107 mL/min/{1.73_m2} (ref >=60)
GFR, Est Non African American: 92 mL/min/{1.73_m2} (ref >=60)
Globulin: 2.9 g/dL (calc) (ref 1.9–3.7)
Glucose, Bld: 74 mg/dL (ref 65–99)
Potassium: 4.4 mmol/L (ref 3.5–5.3)
Sodium: 140 mmol/L (ref 135–146)
Total Bilirubin: 0.4 mg/dL (ref 0.2–1.2)
Total Protein: 7.2 g/dL (ref 6.1–8.1)

## 2021-02-11 LAB — TSH: TSH: 2.3 mIU/L

## 2021-02-11 LAB — VITAMIN D 25 HYDROXY (VIT D DEFICIENCY, FRACTURES): Vit D, 25-Hydroxy: 39 ng/mL (ref 30–100)

## 2021-02-11 LAB — LIPASE: Lipase: 28 U/L (ref 7–60)

## 2021-02-11 LAB — H. PYLORI BREATH TEST: H. pylori Breath Test: NOT DETECTED

## 2021-02-11 NOTE — Telephone Encounter (Signed)
Got message from pharmacy that states ins. Requires 90 day supply to fill at [facility]

## 2021-02-11 NOTE — Addendum Note (Signed)
Addended by: Delsa Grana on: 02/11/2021 02:43 PM   Modules accepted: Orders

## 2021-02-11 NOTE — Telephone Encounter (Signed)
VM left for pharmacy

## 2021-02-12 ENCOUNTER — Encounter: Payer: Self-pay | Admitting: *Deleted

## 2021-02-12 ENCOUNTER — Encounter: Payer: Self-pay | Admitting: Family Medicine

## 2021-02-12 DIAGNOSIS — R1011 Right upper quadrant pain: Secondary | ICD-10-CM

## 2021-02-12 DIAGNOSIS — K219 Gastro-esophageal reflux disease without esophagitis: Secondary | ICD-10-CM

## 2021-02-16 ENCOUNTER — Telehealth: Payer: Self-pay | Admitting: Family Medicine

## 2021-02-16 MED ORDER — PANTOPRAZOLE SODIUM 40 MG PO TBEC: 40.0000 mg | DELAYED_RELEASE_TABLET | Freq: Every day | ORAL | 0 refills | Status: DC

## 2021-02-16 MED ORDER — PANTOPRAZOLE SODIUM 40 MG PO TBEC
DELAYED_RELEASE_TABLET | ORAL | 0 refills | Status: DC
Start: 2021-02-16 — End: 2021-02-16

## 2021-02-16 NOTE — Telephone Encounter (Signed)
Patient called to ask is she could get a new script for her medication, pantoprazole (PROTONIX) 40 MG tablet, which she said she needs for 90 days in order for her insurance to cover it.  Please advise and call patient to discuss at [phone]

## 2021-02-16 NOTE — Telephone Encounter (Signed)
Last seen 4.19.2022 by Kristeen Miss Upcoming appt 6.15.2022 with Kristeen Miss

## 2021-02-18 ENCOUNTER — Ambulatory Visit: Payer: BC Managed Care – PPO | Admitting: Dermatology

## 2021-02-26 ENCOUNTER — Ambulatory Visit
Admission: RE | Admit: 2021-02-26 | Discharge: 2021-02-26 | Disposition: A | Discharge status: Home / Self Care | Payer: BC Managed Care – PPO | Source: Ambulatory Visit | Attending: Family Medicine | Admitting: Family Medicine

## 2021-02-26 ENCOUNTER — Other Ambulatory Visit: Payer: Self-pay

## 2021-02-26 DIAGNOSIS — Z1231 Encounter for screening mammogram for malignant neoplasm of breast: Secondary | ICD-10-CM | POA: Diagnosis not present

## 2021-03-11 ENCOUNTER — Ambulatory Visit: Payer: BC Managed Care – PPO | Admitting: Dermatology

## 2021-03-11 ENCOUNTER — Other Ambulatory Visit: Payer: Self-pay

## 2021-03-11 DIAGNOSIS — L578 Other skin changes due to chronic exposure to nonionizing radiation: Secondary | ICD-10-CM | POA: Diagnosis not present

## 2021-03-11 DIAGNOSIS — D489 Neoplasm of uncertain behavior, unspecified: Secondary | ICD-10-CM

## 2021-03-11 DIAGNOSIS — D2262 Melanocytic nevi of left upper limb, including shoulder: Secondary | ICD-10-CM | POA: Diagnosis not present

## 2021-03-11 MED ORDER — MUPIROCIN 2 % EX OINT: 1.0000 "application " | TOPICAL_OINTMENT | Freq: Every day | CUTANEOUS | 0 refills | Status: DC

## 2021-03-11 NOTE — Progress Notes (Signed)
   New Patient Visit  Subjective  Laura Watson is a 40 y.o. female who presents for the following: New Patient (Initial Visit) (Patient here today for lesion on right lower leg that has been present for years. Patient prescribed by pcp  clobetasol 0.05% ointment to apply to area a month ago. Patient states area is looking much better and has not needed to use clobetasol since.   Patient denies family or personal history of skin cancer.   Patient has mole on side of left forearm she would like checked. Mole present for years just recently noticed changing colors   Objective  Well appearing patient in no apparent distress; mood and affect are within normal limits.  A focused examination was performed including left forearm and right lower leg . Relevant physical exam findings are noted in the Assessment and Plan.  Objective  Left Forearm: 0.45 cm pink papule with dark brown focus at 11 oclock        Assessment & Plan  Neoplasm of uncertain behavior Left Forearm  Skin / nail biopsy Type of biopsy: punch   Informed consent: discussed and consent obtained   Timeout: patient name, date of birth, surgical site, and procedure verified   Patient was prepped and draped in usual sterile fashion: Area prepped with isopropyl alcohol. Anesthesia: the lesion was anesthetized in a standard fashion   Anesthetic:  0.5% bupivicaine w/ epinephrine 1-100,000 local infiltration Punch size:  5 mm Suture size:  4-0 Suture type: Prolene (polypropylene)   Hemostasis achieved with: suture and aluminum chloride   Outcome: patient tolerated procedure well   Post-procedure details: wound care instructions given   Additional details:  Mupirocin and a dressing applied  mupirocin ointment (BACTROBAN) 2 %  Punch removal   R/o atypia   biopsy to sent [facility]    Other Related Procedures Anatomic Pathology Report  Actinic Damage - chronic, secondary to cumulative UV radiation exposure/sun  exposure over time - diffuse scaly erythematous macules with underlying dyspigmentation - Recommend daily broad spectrum sunscreen SPF 30+ to sun-exposed areas, reapply every 2 hours as needed.  - Recommend staying in the shade or wearing long sleeves, sun glasses (UVA+UVB protection) and wide brim hats (4-inch brim around the entire circumference of the hat). - Call for new or changing lesions.  Return in about 2 weeks (around 03/25/2021) for suture removal .  I, Ruthell Rummage, CMA, am acting as scribe for Forest Gleason, MD.  Documentation: I have reviewed the above documentation for accuracy and completeness, and I agree with the above.  Forest Gleason, MD

## 2021-03-11 NOTE — Patient Instructions (Addendum)
Melanoma ABCDEs  Melanoma is the most dangerous type of skin cancer, and is the leading cause of death from skin disease.  You are more likely to develop melanoma if you:  Have light-colored skin, light-colored eyes, or red or blond hair  Spend a lot of time in the sun  Tan regularly, either outdoors or in a tanning bed  Have had blistering sunburns, especially during childhood  Have a close family member who has had a melanoma  Have atypical moles or large birthmarks  Early detection of melanoma is key since treatment is typically straightforward and cure rates are extremely high if we catch it early.   The first sign of melanoma is often a change in a mole or a new dark spot.  The ABCDE system is a way of remembering the signs of melanoma.  A for asymmetry:  The two halves do not match. B for border:  The edges of the growth are irregular. C for color:  A mixture of colors are present instead of an even brown color. D for diameter:  Melanomas are usually (but not always) greater than 78mm - the size of a pencil eraser. E for evolution:  The spot keeps changing in size, shape, and color.  Please check your skin once per month between visits. You can use a small mirror in front and a large mirror behind you to keep an eye on the back side or your body.   If you see any new or changing lesions before your next follow-up, please call to schedule a visit.  Please continue daily skin protection including broad spectrum sunscreen SPF 30+ to sun-exposed areas, reapplying every 2 hours as needed when you're outdoors.   Recommend taking Heliocare sun protection supplement daily in sunny weather for additional sun protection. For maximum protection on the sunniest days, you can take up to 2 capsules of regular Heliocare OR take 1 capsule of Heliocare Ultra. For prolonged exposure (such as a full day in the sun), you can repeat your dose of the supplement 4 hours after your first dose.  Heliocare can be purchased at [facility] or at [email or web address].     Biopsy Wound Care Instructions  1. Leave the original bandage on for 24 hours if possible.  If the bandage becomes soaked or soiled before that time, it is OK to remove it and examine the wound.  A small amount of post-operative bleeding is normal.  If excessive bleeding occurs, remove the bandage, place gauze over the site and apply continuous pressure (no peeking) over the area for 30 minutes. If this does not work, please call our clinic as soon as possible or page your doctor if it is after hours.   2. Once a day, cleanse the wound with soap and water. It is fine to shower. If a thick crust develops you may use a Q-tip dipped into dilute hydrogen peroxide (mix 1:1 with water) to dissolve it.  Hydrogen peroxide can slow the healing process, so use it only as needed.    3. After washing, apply petroleum jelly (Vaseline) or an antibiotic ointment if your doctor prescribed one for you, followed by a bandage.    4. For best healing, the wound should be covered with a layer of ointment at all times. If you are not able to keep the area covered with a bandage to hold the ointment in place, this may mean re-applying the ointment several times a day.  Continue this wound care  until the wound has healed and is no longer open.   Itching and mild discomfort is normal during the healing process. However, if you develop pain or severe itching, please call our office.   If you have any discomfort, you can take Tylenol (acetaminophen) or ibuprofen as directed on the bottle. (Please do not take these if you have an allergy to them or cannot take them for another reason).  Some redness, tenderness and white or yellow material in the wound is normal healing.  If the area becomes very sore and red, or develops a thick yellow-green material (pus), it may be infected; please notify us.    If you have stitches, return to clinic as  directed to have the stitches removed. You will continue wound care for 2-3 days after the stitches are removed.   Wound healing continues for up to one year following surgery. It is not unusual to experience pain in the scar from time to time during the interval.  If the pain becomes severe or the scar thickens, you should notify the office.    A slight amount of redness in a scar is expected for the first six months.  After six months, the redness will fade and the scar will soften and fade.  The color difference becomes less noticeable with time.  If there are any problems, return for a post-op surgery check at your earliest convenience.  To improve the appearance of the scar, you can use silicone scar gel, cream, or sheets (such as Mederma or Serica) every night for up to one year. These are available over the counter (without a prescription).  Please call our office at [phone] for any questions or concerns      If you have any questions or concerns for your doctor, please call our main line at [phone] and press option 4 to reach your doctor's medical assistant. If no one answers, please leave a voicemail as directed and we will return your call as soon as possible. Messages left after 4 pm will be answered the following business day.   You may also send [place] a message via [place]. We typically respond to MyChart messages within 1-2 business days.  For prescription refills, please ask your pharmacy to contact our office. Our fax number is [phone].  If you have an urgent issue when the clinic is closed that cannot wait until the next business day, you can page your doctor at the number below.    Please note that while we do our best to be available for urgent issues outside of office hours, we are not available 24/7.   If you have an urgent issue and are unable to reach [place], you may choose to seek medical care at your doctor's office, retail clinic, urgent care center, or  emergency room.  If you have a medical emergency, please immediately call 911 or go to the emergency department.  Pager Numbers  - Dr. Nehemiah Massed: [phone]  - Dr. Laurence Ferrari: [phone]  - Dr. Nicole Kindred: [phone]  In the event of inclement weather, please call our main line at [phone] for an update on the status of any delays or closures.  Dermatology Medication Tips: Please keep the boxes that topical medications come in in order to help keep track of the instructions about where and how to use these. Pharmacies typically print the medication instructions only on the boxes and not directly on the medication tubes.   If your medication is too expensive, please contact  our office at [phone] option 4 or send [place] a message through [place].   We are unable to tell what your co-pay for medications will be in advance as this is different depending on your insurance coverage. However, we may be able to find a substitute medication at lower cost or fill out paperwork to get insurance to cover a needed medication.   If a prior authorization is required to get your medication covered by your insurance company, please allow [place] 1-2 business days to complete this process.  Drug prices often vary depending on where the prescription is filled and some pharmacies may offer cheaper prices.  The website www.goodrx.com contains coupons for medications through different pharmacies. The prices here do not account for what the cost may be with help from insurance (it may be cheaper with your insurance), but the website can give you the price if you did not use any insurance.  - You can print the associated coupon and take it with your prescription to the pharmacy.  - You may also stop by our office during regular business hours and pick up a GoodRx coupon card.  - If you need your prescription sent electronically to a different pharmacy, notify our office through [facility] or by phone at  [phone] option 4.

## 2021-03-16 ENCOUNTER — Encounter: Payer: Self-pay | Admitting: Dermatology

## 2021-03-20 LAB — ANATOMIC PATHOLOGY REPORT

## 2021-03-25 ENCOUNTER — Ambulatory Visit: Payer: BC Managed Care – PPO | Admitting: Dermatology

## 2021-03-25 ENCOUNTER — Other Ambulatory Visit: Payer: Self-pay

## 2021-03-25 DIAGNOSIS — D229 Melanocytic nevi, unspecified: Secondary | ICD-10-CM

## 2021-03-25 DIAGNOSIS — D2262 Melanocytic nevi of left upper limb, including shoulder: Secondary | ICD-10-CM | POA: Diagnosis not present

## 2021-03-25 NOTE — Patient Instructions (Addendum)
Recommend Serica moisturizing scar formula cream every night or [facility] brand or Mederma silicone scar sheet every night for the first year after a scar appears to help with scar remodeling if desired. Scars remodel on their own for a full year.  Recommend daily broad spectrum sunscreen SPF 30+ to sun-exposed areas, reapply every 2 hours as needed. Call for new or changing lesions.  Staying in the shade or wearing long sleeves, sun glasses (UVA+UVB protection) and wide brim hats (4-inch brim around the entire circumference of the hat) are also recommended for sun protection.   Recommend taking Heliocare sun protection supplement daily in sunny weather for additional sun protection. For maximum protection on the sunniest days, you can take up to 2 capsules of regular Heliocare OR take 1 capsule of Heliocare Ultra. For prolonged exposure (such as a full day in the sun), you can repeat your dose of the supplement 4 hours after your first dose. Heliocare can be purchased at [facility] or at [email or web address].

## 2021-03-25 NOTE — Progress Notes (Signed)
   Follow-Up Visit   Subjective  Laura Watson is a 40 y.o. female who presents for the following: post op./suture removal (Patient is here today for suture removal for bx proven benign nevus of the L forearm. ).  The following portions of the chart were reviewed this encounter and updated as appropriate:   Tobacco  Allergies  Meds  Problems  Med Hx  Surg Hx  Fam Hx      Review of Systems:  No other skin or systemic complaints except as noted in HPI or Assessment and Plan.  Objective  Well appearing patient in no apparent distress; mood and affect are within normal limits.  A focused examination was performed including the L forearm . Relevant physical exam findings are noted in the Assessment and Plan.   Assessment & Plan  Nevus L forearm  Encounter for Removal of Sutures - Incision site at the L forearm is clean, dry and intact - Wound cleansed, sutures removed, wound cleansed and steri strips applied.  - Discussed pathology results showing a benign nevus. - Patient advised to keep steri-strips dry until they fall off. - Scars remodel for a full year. - Once steri-strips fall off, patient can apply over-the-counter silicone scar cream each night to help with scar remodeling if desired. - Patient advised to call with any concerns or if they notice any new or changing lesions.   Return if symptoms worsen or fail to improve.  Luther Redo, CMA, am acting as scribe for Forest Gleason, MD .   Documentation: I have reviewed the above documentation for accuracy and completeness, and I agree with the above.  Forest Gleason, MD

## 2021-04-02 ENCOUNTER — Encounter: Payer: Self-pay | Admitting: Dermatology

## 2021-04-08 ENCOUNTER — Ambulatory Visit (INDEPENDENT_AMBULATORY_CARE_PROVIDER_SITE_OTHER): Payer: BC Managed Care – PPO | Admitting: Family Medicine

## 2021-04-08 ENCOUNTER — Other Ambulatory Visit: Payer: Self-pay

## 2021-04-08 ENCOUNTER — Encounter: Payer: Self-pay | Admitting: Family Medicine

## 2021-04-08 VITALS — BP 116/72 | HR 100 | Temp 98.4°F | Resp 16 | Ht 64.0 in | Wt 198.1 lb

## 2021-04-08 DIAGNOSIS — E669 Obesity, unspecified: Secondary | ICD-10-CM

## 2021-04-08 DIAGNOSIS — K219 Gastro-esophageal reflux disease without esophagitis: Secondary | ICD-10-CM | POA: Diagnosis not present

## 2021-04-08 DIAGNOSIS — F321 Major depressive disorder, single episode, moderate: Secondary | ICD-10-CM

## 2021-04-08 DIAGNOSIS — R1011 Right upper quadrant pain: Secondary | ICD-10-CM | POA: Diagnosis not present

## 2021-04-08 DIAGNOSIS — E559 Vitamin D deficiency, unspecified: Secondary | ICD-10-CM

## 2021-04-08 DIAGNOSIS — F419 Anxiety disorder, unspecified: Secondary | ICD-10-CM

## 2021-04-08 DIAGNOSIS — E785 Hyperlipidemia, unspecified: Secondary | ICD-10-CM

## 2021-04-08 DIAGNOSIS — R41840 Attention and concentration deficit: Secondary | ICD-10-CM

## 2021-04-08 DIAGNOSIS — Z6834 Body mass index (BMI) 34.0-34.9, adult: Secondary | ICD-10-CM

## 2021-04-08 MED ORDER — PANTOPRAZOLE SODIUM 40 MG PO TBEC: 40.0000 mg | DELAYED_RELEASE_TABLET | Freq: Every day | ORAL | 0 refills | Status: DC

## 2021-04-08 MED ORDER — BUSPIRONE HCL 15 MG PO TABS: 7.5000 mg | ORAL_TABLET | Freq: Two times a day (BID) | ORAL | 3 refills | Status: DC | PRN

## 2021-04-08 MED ORDER — ESCITALOPRAM OXALATE 20 MG PO TABS: 20.0000 mg | ORAL_TABLET | Freq: Every day | ORAL | 3 refills | Status: DC

## 2021-04-08 NOTE — Progress Notes (Signed)
Name: AYESHA MARKWELL   MRN: [id number]    DOB: 09/07/1981   Date:04/08/2021       Progress Note  Chief Complaint  Patient presents with   Depression   Anxiety    Subjective:   Laura Watson is a 40 y.o. female, presents to clinic for routine f/up and med refill  Depression screen [facility] 2/9 04/08/2021 02/10/2021 10/07/2020  Decreased Interest 0 0 0  Down, Depressed, Hopeless 0 0 0  PHQ - 2 Score 0 0 0  Altered sleeping 1 - 1  Tired, decreased energy 1 - 1  Change in appetite 0 - 1  Feeling bad or failure about yourself  0 - 1  Trouble concentrating 3 - 1  Moving slowly or fidgety/restless 3 - 0  Suicidal thoughts 0 - 0  PHQ-9 Score 8 - 5  Difficult doing work/chores Somewhat difficult - Somewhat difficult  Some recent data might be hidden   GAD 7 : Generalized Anxiety Score 04/08/2021 10/07/2020 04/07/2020 01/16/2018  Nervous, Anxious, on Edge 0 0 1 3  Control/stop worrying 1 0 0 2  Worry too much - different things 1 0 0 2  Trouble relaxing 1 1 0 3  Restless 1 0 1 3  Easily annoyed or irritable 0 1 1 3   Afraid - awful might happen 0 0 0 2  Total GAD 7 Score 4 2 3 18   Anxiety Difficulty - Somewhat difficult Not difficult at all Very difficult   Trouble with sleep was trying trazodone - still working for sleep  Buspar 15 mg BID prn - she is mostly using one a day in the morning and not needing more She continues to take Lexapro 20 mg and she feels this is working well for her She is concerned about difficulty with attention and wants ADD/ADHD eval -she has had people at work tell her that her attention and focus is lacking and seems to be getting worse and she would like a evaluation.  She feels that she is always had some symptoms since she was a child but has never had a evaluation or been treated with medications.  Pt previously had abd pain and protonix was helpful, this was about 2 months ago. Abd pain much better, she has only skipped Protonix medication for a day or 2  here there but she has not tried to stop the medication, she is not having any indigestion, abdominal pain, reflux, melena, hematochezia, change in bowels.  Not trying any Pepcid or Tums  She has elevated cholesterol Lab Results  Component Value Date   CHOL 188 02/10/2021   HDL 43 (L) 02/10/2021   LDLCALC 116 (H) 02/10/2021   TRIG 175 (H) 02/10/2021   CHOLHDL 4.4 02/10/2021  She would like to work on diet and exercise and recheck next year at her physical    Current Outpatient Medications:    acetaminophen (TYLENOL) 325 MG tablet, Take 650 mg by mouth as needed., Disp: , Rfl:    busPIRone (BUSPAR) 15 MG tablet, Take 0.5-1 tablets (7.5-15 mg total) by mouth 2 (two) times daily as needed., Disp: 180 tablet, Rfl: 3   clobetasol ointment (TEMOVATE) 0.93 %, Apply 1 application topically 2 (two) times daily., Disp: 60 g, Rfl: 1   escitalopram (LEXAPRO) 20 MG tablet, Take 1 tablet (20 mg total) by mouth daily., Disp: 90 tablet, Rfl: 3   fluticasone (FLONASE) 50 MCG/ACT nasal spray, Place 2 sprays into both nostrils daily., Disp: 16 g, Rfl:  5   ibuprofen (ADVIL,MOTRIN) 200 MG tablet, Take 400 mg by mouth as needed., Disp: , Rfl:    levocetirizine (XYZAL) 5 MG tablet, Take 1 tablet (5 mg total) by mouth every evening., Disp: 90 tablet, Rfl: 3   levonorgestrel (MIRENA) 20 MCG/24HR IUD, 1 Intra Uterine Device by Intrauterine route continuous., Disp: , Rfl:    pantoprazole (PROTONIX) 40 MG tablet, Take 1 tablet (40 mg total) by mouth daily., Disp: 90 tablet, Rfl: 0   traZODone (DESYREL) 50 MG tablet, Take 1 tablet (50 mg total) by mouth at bedtime as needed for sleep., Disp: 90 tablet, Rfl: 3  Patient Active Problem List   Diagnosis Date Noted   Mass of right side of neck 10/11/2019   Back pain 08/14/2019   Neck pain 08/14/2019   Symptomatic mammary hypertrophy 08/14/2019   Anxiety disorder 01/16/2018   Current moderate episode of major depressive disorder without prior episode ([place]) 01/16/2018    Class 1 obesity due to excess calories without serious comorbidity with body mass index (BMI) of 30.0 to 30.9 in adult 01/16/2018   Family history of diabetes mellitus 01/16/2018   Fatigue 01/16/2018   History of vitamin D deficiency 01/16/2018    Past Surgical History:  Procedure Laterality Date   APPENDECTOMY  2009   BREAST REDUCTION SURGERY Bilateral 01/16/2020   Procedure: MAMMARY REDUCTION  (BREAST);  Surgeon: Wallace Going, DO;  Location: [facility];  Service: Plastics;  Laterality: Bilateral;  3.5 hours   CESAREAN SECTION     x2   REDUCTION MAMMAPLASTY Bilateral 12/2019    Family History  Problem Relation Age of Onset   Diabetes Mother    Cancer Mother 34       lymphoma   Diabetes Father    Pulmonary fibrosis Father    Diabetes Sister    Diabetes Brother    Dementia Maternal Grandmother    Alzheimer's disease Maternal Grandfather    Breast cancer Neg Hx     Social History   Tobacco Use   Smoking status: Never   Smokeless tobacco: Never  Vaping Use   Vaping Use: Never used  Substance Use Topics   Alcohol use: Yes    Comment: occ   Drug use: No     No Known Allergies  Health Maintenance  Topic Date Due   Hepatitis C Screening  Never done   COVID-19 Vaccine (3 - Pfizer risk series) 03/18/2020   Zoster Vaccines- Shingrix (1 of 2) 07/09/2021 (Originally 08/03/1999)   Pneumococcal Vaccine 79-29 Years old (1 - PCV) 04/08/2022 (Originally 08/02/1986)   INFLUENZA VACCINE  05/25/2021   PAP SMEAR-Modifier  12/06/2021   TETANUS/TDAP  01/17/2029   HIV Screening  Completed   HPV VACCINES  Aged Out    Chart Review Today: I personally reviewed active problem list, medication list, allergies, family history, social history, health maintenance, notes from last encounter, lab results, imaging with the patient/caregiver today.   Review of Systems  Constitutional: Negative.   HENT: Negative.    Eyes: Negative.   Respiratory: Negative.     Cardiovascular: Negative.   Gastrointestinal: Negative.   Endocrine: Negative.   Genitourinary: Negative.   Musculoskeletal: Negative.   Skin: Negative.   Allergic/Immunologic: Negative.   Neurological: Negative.   Hematological: Negative.   Psychiatric/Behavioral: Negative.    All other systems reviewed and are negative.   Objective:   Vitals:   04/08/21 1523  BP: 116/72  Pulse: 100  Resp: 16  Temp: 98.4  F (36.9 C)  SpO2: 97%  Weight: 198 lb 1.6 oz (89.9 kg)  Height: 5\' 4"  (1.626 m)    Body mass index is 34 kg/m.  Physical Exam Vitals and nursing note reviewed.  Constitutional:      General: She is not in acute distress.    Appearance: Normal appearance. She is obese. She is not ill-appearing, toxic-appearing or diaphoretic.  HENT:     Head: Normocephalic and atraumatic.     Right Ear: External ear normal.     Left Ear: External ear normal.  Eyes:     General:        Right eye: No discharge.        Left eye: No discharge.     Conjunctiva/sclera: Conjunctivae normal.  Cardiovascular:     Rate and Rhythm: Normal rate and regular rhythm.     Pulses: Normal pulses.     Heart sounds: Normal heart sounds.  Pulmonary:     Effort: Pulmonary effort is normal.     Breath sounds: Normal breath sounds.  Skin:    General: Skin is warm.     Coloration: Skin is not pale.     Findings: No lesion or rash.  Neurological:     Mental Status: She is alert. Mental status is at baseline.     Gait: Gait normal.  Psychiatric:        Attention and Perception: Attention normal. She is attentive.        Mood and Affect: Mood is anxious. Mood is not depressed.        Speech: Speech normal.        Behavior: Behavior is hyperactive. Behavior is not aggressive. Behavior is cooperative.        Thought Content: Thought content normal. Thought content does not include homicidal or suicidal ideation. Thought content does not include homicidal or suicidal plan.     Comments: Good eye  contact Shaking leg throughout visit - appears slightly hyperactive or anxious        Assessment & Plan:     ICD-10-CM   1. Current moderate episode of major depressive disorder without prior episode (HCC)  F32.1 busPIRone (BUSPAR) 15 MG tablet    escitalopram (LEXAPRO) 20 MG tablet   phq positive but mostly sx related to focus/attention, mood good, no SI/HI, continue lexapro    2. Anxiety disorder, unspecified type  F41.9 busPIRone (BUSPAR) 15 MG tablet    escitalopram (LEXAPRO) 20 MG tablet   well controlled sx on lexapro and buspar    3. Right upper quadrant abdominal pain  R10.11 pantoprazole (PROTONIX) 40 MG tablet   improved and resolved with PPI - wean off, gastritis? PUD?     4. Gastroesophageal reflux disease, unspecified whether esophagitis present  K21.9 pantoprazole (PROTONIX) 40 MG tablet   wean off PPI in the next month with pepcid BID - pt instructed to notify me if pain worsens, she will need GI consult    5. Vitamin D deficiency  E55.9    last labs show Vit D supplemented to normal range, continue OTC supplement    6. Class 1 obesity with body mass index (BMI) of 34.0 to 34.9 in adult, unspecified obesity type, unspecified whether serious comorbidity present  E66.9    Z68.34     7. Attention and concentration deficit  R41.840 Ambulatory referral to Psychiatry   concerns with attention, forgetfulness, staying on task, completion of work, would like formal eval, no prior dx, but sx for  a long time - referred to specialis    8. Hyperlipidemia, unspecified hyperlipidemia type  E78.5    encouraged to work on diet/lifestyle - recheck at next CPE     F/up next April for CPE  Delsa Grana, PA-C 04/08/21 3:40 PM

## 2021-04-08 NOTE — Patient Instructions (Signed)
[facility] Location [facility] [phone] [place], [place] [address]  Phone: [phone] Email: [email or web address]  Hours of Operation Monday to Friday 8:00 AM - 5:00 PM Saturday and Sunday: Closed  Can try [facility] or [facility] in [place] I also recommend calling your insurance and seeing who is in network and accepting patients for a evaluation

## 2021-04-09 ENCOUNTER — Ambulatory Visit: Payer: BC Managed Care – PPO | Admitting: Plastic Surgery

## 2021-04-09 DIAGNOSIS — E669 Obesity, unspecified: Secondary | ICD-10-CM | POA: Insufficient documentation

## 2021-04-09 DIAGNOSIS — Z6834 Body mass index (BMI) 34.0-34.9, adult: Secondary | ICD-10-CM | POA: Insufficient documentation

## 2021-04-09 DIAGNOSIS — E559 Vitamin D deficiency, unspecified: Secondary | ICD-10-CM | POA: Insufficient documentation

## 2021-04-23 ENCOUNTER — Ambulatory Visit: Payer: BC Managed Care – PPO | Admitting: Gastroenterology

## 2021-04-23 DIAGNOSIS — Z03818 Encounter for observation for suspected exposure to other biological agents ruled out: Secondary | ICD-10-CM | POA: Diagnosis not present

## 2021-04-23 DIAGNOSIS — Z20822 Contact with and (suspected) exposure to covid-19: Secondary | ICD-10-CM | POA: Diagnosis not present

## 2021-07-23 ENCOUNTER — Encounter: Payer: Self-pay | Admitting: Family Medicine

## 2021-07-23 ENCOUNTER — Other Ambulatory Visit: Payer: Self-pay

## 2021-07-23 ENCOUNTER — Ambulatory Visit (INDEPENDENT_AMBULATORY_CARE_PROVIDER_SITE_OTHER): Payer: BC Managed Care – PPO | Admitting: Family Medicine

## 2021-07-23 VITALS — BP 116/72 | HR 87 | Temp 98.5°F | Resp 16 | Ht 64.0 in | Wt 198.5 lb

## 2021-07-23 DIAGNOSIS — R41840 Attention and concentration deficit: Secondary | ICD-10-CM

## 2021-07-23 DIAGNOSIS — G4700 Insomnia, unspecified: Secondary | ICD-10-CM

## 2021-07-23 DIAGNOSIS — Z1231 Encounter for screening mammogram for malignant neoplasm of breast: Secondary | ICD-10-CM

## 2021-07-23 DIAGNOSIS — Z23 Encounter for immunization: Secondary | ICD-10-CM

## 2021-07-23 DIAGNOSIS — Z0000 Encounter for general adult medical examination without abnormal findings: Secondary | ICD-10-CM

## 2021-07-23 DIAGNOSIS — Z6834 Body mass index (BMI) 34.0-34.9, adult: Secondary | ICD-10-CM

## 2021-07-23 DIAGNOSIS — E559 Vitamin D deficiency, unspecified: Secondary | ICD-10-CM

## 2021-07-23 DIAGNOSIS — F321 Major depressive disorder, single episode, moderate: Secondary | ICD-10-CM | POA: Diagnosis not present

## 2021-07-23 DIAGNOSIS — K219 Gastro-esophageal reflux disease without esophagitis: Secondary | ICD-10-CM

## 2021-07-23 DIAGNOSIS — Z30431 Encounter for routine checking of intrauterine contraceptive device: Secondary | ICD-10-CM

## 2021-07-23 DIAGNOSIS — F419 Anxiety disorder, unspecified: Secondary | ICD-10-CM | POA: Diagnosis not present

## 2021-07-23 DIAGNOSIS — E785 Hyperlipidemia, unspecified: Secondary | ICD-10-CM

## 2021-07-23 DIAGNOSIS — Z1159 Encounter for screening for other viral diseases: Secondary | ICD-10-CM

## 2021-07-23 DIAGNOSIS — E669 Obesity, unspecified: Secondary | ICD-10-CM

## 2021-07-23 MED ORDER — PANTOPRAZOLE SODIUM 40 MG PO TBEC: 40.0000 mg | DELAYED_RELEASE_TABLET | Freq: Every day | ORAL | 0 refills | Status: DC

## 2021-07-23 MED ORDER — VITAMIN D3 50 MCG (2000 UT) PO CAPS: 2000.0000 [IU] | ORAL_CAPSULE | Freq: Every day | ORAL | Status: AC

## 2021-07-23 NOTE — Progress Notes (Signed)
Patient: Laura Watson, Female    DOB: 02/12/1981, 40 y.o.   MRN: [id number] Delsa Grana, PA-C Visit Date: 07/23/2021  Today's Provider: Delsa Grana, PA-C   Chief Complaint  Patient presents with   Annual Exam   Subjective:   Annual physical exam:  Laura Watson is a 40 y.o. female who presents today for complete physical exam:  Exercise/Activity:   3 d a week 30 min + Diet/nutrition:   eating more foods from home Sleep:  no concerns, trazodone doing well at 50 mg dose   SDOH Screenings   Alcohol Screen: Low Risk    Last Alcohol Screening Score (AUDIT): 0  Depression (PHQ2-9): Low Risk    PHQ-2 Score: 0  Financial Resource Strain: Low Risk    Difficulty of Paying Living Expenses: Not hard at all  Food Insecurity: No Food Insecurity   Worried About Charity fundraiser in the Last Year: Never true   Ran Out of Food in the Last Year: Never true  Housing: Low Risk    Last Housing Risk Score: 0  Physical Activity: Insufficiently Active   Days of Exercise per Week: 3 days   Minutes of Exercise per Session: 30 min  Social Connections: Moderately Isolated   Frequency of Communication with Friends and Family: Twice a week   Frequency of Social Gatherings with Friends and Family: Once a week   Attends Religious Services: Never   Marine scientist or Organizations: No   Attends Archivist Meetings: Never   Marital Status: Living with partner  Stress: No Stress Concern Present   Feeling of Stress : Only a little  Tobacco Use: Low Risk    Smoking Tobacco Use: Never   Smokeless Tobacco Use: Never  Transportation Needs: No Transportation Needs   Lack of Transportation (Medical): No   Lack of Transportation (Non-Medical): No      USPSTF grade A and B recommendations - reviewed and addressed today  Depression:  Phq 9 completed today by patient, was reviewed by me with patient in the room PHQ score is neg, pt feels good PHQ 2/9 Scores 07/23/2021  04/08/2021 02/10/2021 10/07/2020  PHQ - 2 Score 0 0 0 0  PHQ- 9 Score 0 8 - 5   Depression screen [facility] 2/9 07/23/2021 04/08/2021 02/10/2021 10/07/2020 04/07/2020  Decreased Interest 0 0 0 0 0  Down, Depressed, Hopeless 0 0 0 0 0  PHQ - 2 Score 0 0 0 0 0  Altered sleeping 0 1 - 1 2  Tired, decreased energy 0 1 - 1 3  Change in appetite 0 0 - 1 0  Feeling bad or failure about yourself  0 0 - 1 0  Trouble concentrating 0 3 - 1 0  Moving slowly or fidgety/restless 0 3 - 0 0  Suicidal thoughts 0 0 - 0 0  PHQ-9 Score 0 8 - 5 5  Difficult doing work/chores Not difficult at all Somewhat difficult - Somewhat difficult Somewhat difficult  Some recent data might be hidden    Alcohol screening: [place] Office Visit from 10/07/2020 in [facility]  AUDIT-C Score 0       Immunizations and Health Maintenance: Health Maintenance  Topic Date Due   Hepatitis C Screening  Never done   COVID-19 Vaccine (3 - Pfizer risk series) 03/18/2020   PAP SMEAR-Modifier  12/06/2021   TETANUS/TDAP  01/17/2029   INFLUENZA VACCINE  Completed   HIV Screening  Completed  HPV VACCINES  Aged Out     Hep C Screening:  due  STD testing and prevention (HIV/chl/gon/syphilis):  see above, no additional testing desired by pt today  Intimate partner violence:  none  Sexual History/Pain during Intercourse: getting married Nov   Menstrual History/LMP/Abnormal Bleeding:   IUD needs replacement soon [facility], positive PAP ASCUS HPV - neg colposcopy - f/up OBGYN No LMP recorded. (Menstrual status: IUD).  Incontinence Symptoms:  none  Breast cancer:   done, due may  Last Mammogram: *see HM list above BRCA gene screening:  none  Cervical cancer screening: see above Pt denies family hx of cancers - breast, ovarian, uterine, colon:     Osteoporosis:   Discussion on osteoporosis per age, including high calcium and vitamin D supplementation, weight bearing exercises Pt is supplementing  with daily Vit D. Vit d deficiency on OTC 2000 IU daily    Skin cancer:  Hx of skin CA -  NO Discussed atypical lesions   Colorectal cancer:   Colonoscopy is not due per age/family hx Discussed concerning signs and sx of CRC, pt denies change in bowels, melena, hematochezia  Lung cancer:   Low Dose CT Chest recommended if Age 43-80 years, 20 pack-year currently smoking OR have quit w/in 15years. Patient does not qualify.    Social History   Tobacco Use   Smoking status: Never   Smokeless tobacco: Never  Vaping Use   Vaping Use: Never used  Substance Use Topics   Alcohol use: Yes    Comment: occ   Drug use: No     Flowsheet Row Office Visit from 10/07/2020 in [facility]  AUDIT-C Score 0       Family History  Problem Relation Age of Onset   Diabetes Mother    Cancer Mother 34       lymphoma   Diabetes Father    Pulmonary fibrosis Father    Diabetes Sister    Diabetes Brother    Dementia Maternal Grandmother    Alzheimer's disease Maternal Grandfather    Breast cancer Neg Hx      Blood pressure/Hypertension: BP Readings from Last 3 Encounters:  07/23/21 116/72  04/08/21 116/72  02/10/21 130/70    Weight/Obesity: Wt Readings from Last 3 Encounters:  07/23/21 198 lb 8 oz (90 kg)  04/08/21 198 lb 1.6 oz (89.9 kg)  02/10/21 197 lb 1.6 oz (89.4 kg)   BMI Readings from Last 3 Encounters:  07/23/21 34.07 kg/m  04/08/21 34.00 kg/m  02/10/21 33.83 kg/m     Lipids:  Lab Results  Component Value Date   CHOL 188 02/10/2021   CHOL 188 04/07/2020   CHOL 176 01/16/2018   Lab Results  Component Value Date   HDL 43 (L) 02/10/2021   HDL 41 04/07/2020   HDL 50 01/16/2018   Lab Results  Component Value Date   LDLCALC 116 (H) 02/10/2021   LDLCALC 126 (H) 04/07/2020   LDLCALC 110 (H) 01/16/2018   Lab Results  Component Value Date   TRIG 175 (H) 02/10/2021   TRIG 118 04/07/2020   TRIG 79 01/16/2018   Lab Results  Component  Value Date   CHOLHDL 4.4 02/10/2021   CHOLHDL 4.6 (H) 04/07/2020   CHOLHDL 3.5 01/16/2018   No results found for: LDLDIRECT Based on the results of lipid panel his/her cardiovascular risk factor ( using Gordon )  in the next 10 years is: The 10-year ASCVD risk score (Arnett DK, et  al., 2019) is: 0.7%   Values used to calculate the score:     Age: 83 years     Sex: Female     Is Non-Hispanic African American: No     Diabetic: No     Tobacco smoker: No     Systolic Blood Pressure: 267 mmHg     Is BP treated: No     HDL Cholesterol: 43 mg/dL     Total Cholesterol: 188 mg/dL  Glucose:  Glucose  Date Value Ref Range Status  04/07/2020 97 65 - 99 mg/dL Final  01/16/2018 75 65 - 99 mg/dL Final   Glucose, Bld  Date Value Ref Range Status  02/10/2021 74 65 - 99 mg/dL Final    Comment:    .            Fasting reference interval .   07/07/2017 89 65 - 99 mg/dL Final   Advanced Care Planning:  A voluntary discussion about advance care planning including the explanation and discussion of advance directives.   Discussed health care proxy and Living will, and the patient was able to identify a health care proxy as fiance.   Patient does not have a living will at present time.   Social History       Social History   Socioeconomic History   Marital status: Divorced    Spouse name: Not on file   Number of children: 2   Years of education: Not on file   Highest education level: Some college, no degree  Occupational History   Not on file  Tobacco Use   Smoking status: Never   Smokeless tobacco: Never  Vaping Use   Vaping Use: Never used  Substance and Sexual Activity   Alcohol use: Yes    Comment: occ   Drug use: No   Sexual activity: Yes    Partners: Male    Birth control/protection: I.U.D.  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   Married,    Social Determinants of  Radio broadcast assistant Strain: Low Risk    Difficulty of Paying Living Expenses: Not hard at all  Food Insecurity: No Food Insecurity   Worried About Charity fundraiser in the Last Year: Never true   Arboriculturist in the Last Year: Never true  Transportation Needs: No Transportation Needs   Lack of Transportation (Medical): No   Lack of Transportation (Non-Medical): No  Physical Activity: Insufficiently Active   Days of Exercise per Week: 3 days   Minutes of Exercise per Session: 30 min  Stress: No Stress Concern Present   Feeling of Stress : Only a little  Social Connections: Moderately Isolated   Frequency of Communication with Friends and Family: Twice a week   Frequency of Social Gatherings with Friends and Family: Once a week   Attends Religious Services: Never   Marine scientist or Organizations: No   Attends Music therapist: Never   Marital Status: Living with partner    Family History        Family History  Problem Relation Age of Onset   Diabetes Mother    Cancer Mother 38       lymphoma   Diabetes Father    Pulmonary fibrosis Father    Diabetes Sister    Diabetes Brother    Dementia Maternal Grandmother    Alzheimer's  disease Maternal Grandfather    Breast cancer Neg Hx     Patient Active Problem List   Diagnosis Date Noted   Class 1 obesity with body mass index (BMI) of 34.0 to 34.9 in adult 04/09/2021   Vitamin D deficiency 04/09/2021   Anxiety disorder 01/16/2018   Current moderate episode of major depressive disorder without prior episode ([place]) 01/16/2018   Family history of diabetes mellitus 01/16/2018    Past Surgical History:  Procedure Laterality Date   APPENDECTOMY  2009   BREAST REDUCTION SURGERY Bilateral 01/16/2020   Procedure: MAMMARY REDUCTION  (BREAST);  Surgeon: Wallace Going, DO;  Location: [facility];  Service: Plastics;  Laterality: Bilateral;  3.5 hours   CESAREAN SECTION     x2    REDUCTION MAMMAPLASTY Bilateral 12/2019     Current Outpatient Medications:    acetaminophen (TYLENOL) 325 MG tablet, Take 650 mg by mouth as needed., Disp: , Rfl:    busPIRone (BUSPAR) 15 MG tablet, Take 0.5-1 tablets (7.5-15 mg total) by mouth 2 (two) times daily as needed., Disp: 180 tablet, Rfl: 3   escitalopram (LEXAPRO) 20 MG tablet, Take 1 tablet (20 mg total) by mouth daily., Disp: 90 tablet, Rfl: 3   fluticasone (FLONASE) 50 MCG/ACT nasal spray, Place 2 sprays into both nostrils daily., Disp: 16 g, Rfl: 5   ibuprofen (ADVIL,MOTRIN) 200 MG tablet, Take 400 mg by mouth as needed., Disp: , Rfl:    levocetirizine (XYZAL) 5 MG tablet, Take 1 tablet (5 mg total) by mouth every evening., Disp: 90 tablet, Rfl: 3   levonorgestrel (MIRENA) 20 MCG/24HR IUD, 1 Intra Uterine Device by Intrauterine route continuous., Disp: , Rfl:    pantoprazole (PROTONIX) 40 MG tablet, Take 1 tablet (40 mg total) by mouth daily., Disp: 30 tablet, Rfl: 0   traZODone (DESYREL) 50 MG tablet, Take 1 tablet (50 mg total) by mouth at bedtime as needed for sleep., Disp: 90 tablet, Rfl: 3   clobetasol ointment (TEMOVATE) 1.61 %, Apply 1 application topically 2 (two) times daily. (Patient not taking: Reported on 07/23/2021), Disp: 60 g, Rfl: 1  No Known Allergies  Patient Care Team: Delsa Grana, PA-C as PCP - General (Family Medicine)   Chart Review: I personally reviewed active problem list, medication list, allergies, family history, social history, health maintenance, notes from last encounter, lab results, imaging with the patient/caregiver today.   Review of Systems  Constitutional: Negative.   HENT: Negative.    Eyes: Negative.   Respiratory: Negative.    Cardiovascular: Negative.   Gastrointestinal: Negative.   Endocrine: Negative.   Genitourinary: Negative.   Musculoskeletal: Negative.   Skin: Negative.   Allergic/Immunologic: Negative.   Neurological: Negative.   Hematological: Negative.    Psychiatric/Behavioral: Negative.    All other systems reviewed and are negative.        Objective:   Vitals:  Vitals:   07/23/21 0852  BP: 116/72  Pulse: 87  Resp: 16  Temp: 98.5 F (36.9 C)  SpO2: 97%  Weight: 198 lb 8 oz (90 kg)  Height: 5' 4"  (1.626 m)    Body mass index is 34.07 kg/m.  Physical Exam Vitals and nursing note reviewed.  Constitutional:      General: She is not in acute distress.    Appearance: Normal appearance. She is well-developed, well-groomed and overweight. She is not ill-appearing, toxic-appearing or diaphoretic.     Interventions: Face mask in place.  HENT:     Head: Normocephalic  and atraumatic.     Right Ear: Hearing, tympanic membrane, ear canal and external ear normal.     Left Ear: Hearing, tympanic membrane, ear canal and external ear normal.     Nose: Mucosal edema, congestion and rhinorrhea present.     Right Sinus: No maxillary sinus tenderness or frontal sinus tenderness.     Left Sinus: No maxillary sinus tenderness or frontal sinus tenderness.     Mouth/Throat:     Mouth: Mucous membranes are moist. Mucous membranes are not pale.     Pharynx: Oropharynx is clear. Uvula midline. No oropharyngeal exudate, posterior oropharyngeal erythema or uvula swelling.     Tonsils: No tonsillar abscesses.  Eyes:     General: Lids are normal. No scleral icterus.       Right eye: No discharge.        Left eye: No discharge.     Conjunctiva/sclera: Conjunctivae normal.  Neck:     Trachea: Phonation normal. No tracheal deviation.  Cardiovascular:     Rate and Rhythm: Normal rate and regular rhythm.     Pulses: Normal pulses.          Radial pulses are 2+ on the right side and 2+ on the left side.       Posterior tibial pulses are 2+ on the right side and 2+ on the left side.     Heart sounds: Normal heart sounds. No murmur heard.   No friction rub. No gallop.  Pulmonary:     Effort: Pulmonary effort is normal. No respiratory distress.      Breath sounds: Normal breath sounds. No stridor. No wheezing, rhonchi or rales.  Chest:     Chest wall: No tenderness.  Abdominal:     General: Bowel sounds are normal. There is no distension.     Palpations: Abdomen is soft.  Musculoskeletal:        General: Normal range of motion.     Cervical back: Normal range of motion and neck supple.     Right lower leg: No edema.     Left lower leg: No edema.  Skin:    General: Skin is warm and dry.     Coloration: Skin is not jaundiced or pale.     Findings: No rash.  Neurological:     Mental Status: She is alert.     Motor: No abnormal muscle tone.     Coordination: Coordination normal.     Gait: Gait normal.  Psychiatric:        Attention and Perception: Attention normal.        Mood and Affect: Mood and affect normal.        Speech: Speech normal.        Behavior: Behavior normal. Behavior is cooperative.        Thought Content: Thought content normal.      Fall Risk: Fall Risk  07/23/2021 04/08/2021 02/10/2021 10/07/2020 04/07/2020  Falls in the past year? 0 0 0 0 0  Number falls in past yr: 0 0 0 0 0  Injury with Fall? 0 0 0 0 0  Follow up - - - Falls evaluation completed -    Functional Status Survey: Is the patient deaf or have difficulty hearing?: No Does the patient have difficulty seeing, even when wearing glasses/contacts?: No Does the patient have difficulty concentrating, remembering, or making decisions?: No Does the patient have difficulty walking or climbing stairs?: No Does the patient have difficulty dressing or bathing?:  No Does the patient have difficulty doing errands alone such as visiting a doctor's office or shopping?: No   Assessment & Plan:    CPE completed today  USPSTF grade A and B recommendations reviewed with patient; age-appropriate recommendations, preventive care, screening tests, etc discussed and encouraged; healthy living encouraged; see AVS for patient education given to  patient  Discussed importance of 150 minutes of physical activity weekly, AHA exercise recommendations given to pt in AVS/handout  Discussed importance of healthy diet:  eating lean meats and proteins, avoiding trans fats and saturated fats, avoid simple sugars and excessive carbs in diet, eat 6 servings of fruit/vegetables daily and drink plenty of water and avoid sweet beverages.    Recommended pt to do annual eye exam and routine dental exams/cleanings  Depression, alcohol, fall screening completed as documented above and per flowsheets  Advance Care planning information and packet discussed and offered today, encouraged pt to discuss with family members/spouse/partner/friends and complete Advanced directive packet and bring copy to office   Reviewed Health Maintenance: Health Maintenance  Topic Date Due   Hepatitis C Screening  Never done   COVID-19 Vaccine (3 - Pfizer risk series) 03/18/2020   PAP SMEAR-Modifier  12/06/2021   TETANUS/TDAP  01/17/2029   INFLUENZA VACCINE  Completed   HIV Screening  Completed   HPV VACCINES  Aged Out    Immunizations: Immunization History  Administered Date(s) Administered   Influenza,inj,Quad PF,6+ Mos 10/07/2020, 07/23/2021   PFIZER(Purple Top)SARS-COV-2 Vaccination 01/21/2020, 02/19/2020   Tdap 01/18/2019   Vaccines:  HPV: up to at age 39 , ask insurance if age between 54-45  Shingrix: 97-64 yo and ask insurance if covered when patient above 30 yo Pneumonia: n/aducated and discussed with patient. Flu:  educated and discussed with patient. COVID:        ICD-10-CM   1. Annual physical exam  Z00.00 CBC w/Diff/Platelet    COMPLETE METABOLIC PANEL WITH GFR    Lipid panel    2. Current moderate episode of major depressive disorder without prior episode (HCC)  F32.1    phq 9 and GAD 7 reviewed stress, mood and anxiety much better, she feels really good    3. Anxiety disorder, unspecified type  F41.9     4. Gastroesophageal reflux  disease, unspecified whether esophagitis present  K21.9 pantoprazole (PROTONIX) 40 MG tablet   stable, well controlled    5. Class 1 obesity with body mass index (BMI) of 34.0 to 34.9 in adult, unspecified obesity type, unspecified whether serious comorbidity present  O27.0 COMPLETE METABOLIC PANEL WITH GFR   Z68.34 Lipid panel    6. Hyperlipidemia, unspecified hyperlipidemia type  E78.5 Lipid panel   she will send [place] labs from work/[facility]    7. Encounter for hepatitis C screening test for low risk patient  Z11.59 Hepatitis C Antibody    CANCELED: Hepatitis C Antibody    8. Need for influenza vaccination  Z23 Flu Vaccine QUAD 6+ mos PF IM (Fluarix Quad PF)    9. Insomnia, unspecified type  G47.00    well controlled with trazodone    10. Encounter for screening mammogram for malignant neoplasm of breast  Z12.31     11. IUD check up  Z30.431 Ambulatory referral to Obstetrics / Gynecology   needs f/up with GYN    12. Attention and concentration deficit  R41.840    put in referral today with pt to [place] [facility]    13. Vitamin D deficiency  E55.9 Cholecalciferol (VITAMIN  D3) 50 MCG (2000 UT) capsule   levels improved to normal, continue supplement daily          Delsa Grana, PA-C 07/23/21 9:03 AM  [facility]

## 2021-07-23 NOTE — Patient Instructions (Addendum)
[facility] Location [employer] [facility] [phone] [address] [place], [place] [address]  Phone: [phone] Email: [email or web address]  Hours of Operation Monday to Friday 8:00 AM - 5:00 PM  Call to follow up and check your insurance coverage  Call [place] for scheduling mammogram -  [facility] at [facility] [place],  [place]  [id number] Get Driving Directions Main: [phone]  Health Maintenance  Topic Date Due   Hepatitis C Screening: USPSTF Recommendation to screen - Ages 103-41 yo.  Never done   COVID-19 Vaccine (3 - Pfizer risk series) 03/18/2020   Pap Smear  12/06/2021   Mammogram  02/26/2022   Tetanus Vaccine  01/17/2029   Flu Shot  Completed   HIV Screening  Completed   HPV Vaccine  Aged Out     Preventive Care 79-51 Years Old, Female Preventive care refers to lifestyle choices and visits with your health care provider that can promote health and wellness. This includes: A yearly physical exam. This is also called an annual wellness visit. Regular dental and eye exams. Immunizations. Screening for certain conditions. Healthy lifestyle choices, such as: Eating a healthy diet. Getting regular exercise. Not using drugs or products that contain nicotine and tobacco. Limiting alcohol use. What can I expect for my preventive care visit? Physical exam Your health care provider will check your: Height and weight. These may be used to calculate your BMI (body mass index). BMI is a measurement that tells if you are at a healthy weight. Heart rate and blood pressure. Body temperature. Skin for abnormal spots. Counseling Your health care provider may ask you questions about your: Past medical problems. Family's medical history. Alcohol, tobacco, and drug use. Emotional well-being. Home life and relationship well-being. Sexual activity. Diet, exercise, and sleep habits. Work and work Statistician. Access to  firearms. Method of birth control. Menstrual cycle. Pregnancy history. What immunizations do I need? Vaccines are usually given at various ages, according to a schedule. Your health care provider will recommend vaccines for you based on your age, medical history, and lifestyle or other factors, such as travel or where you work. What tests do I need? Blood tests Lipid and cholesterol levels. These may be checked every 5 years, or more often if you are over 17 years old. Hepatitis C test. Hepatitis B test. Screening Lung cancer screening. You may have this screening every year starting at age 41 if you have a 30-pack-year history of smoking and currently smoke or have quit within the past 15 years. Colorectal cancer screening. All adults should have this screening starting at age 24 and continuing until age 41. Your health care provider may recommend screening at age 41 if you are at increased risk. You will have tests every 1-10 years, depending on your results and the type of screening test. Diabetes screening. This is done by checking your blood sugar (glucose) after you have not eaten for a while (fasting). You may have this done every 1-3 years. Mammogram. This may be done every 1-2 years. Talk with your health care provider about when you should start having regular mammograms. This may depend on whether you have a family history of breast cancer. BRCA-related cancer screening. This may be done if you have a family history of breast, ovarian, tubal, or peritoneal cancers. Pelvic exam and Pap test. This may be done every 3 years starting at age 61. Starting at age 65, this may be done every 5 years if you have  a Pap test in combination with an HPV test. Other tests STD (sexually transmitted disease) testing, if you are at risk. Bone density scan. This is done to screen for osteoporosis. You may have this scan if you are at high risk for osteoporosis. Talk with your health care  provider about your test results, treatment options, and if necessary, the need for more tests. Follow these instructions at home: Eating and drinking  Eat a diet that includes fresh fruits and vegetables, whole grains, lean protein, and low-fat dairy products. Take vitamin and mineral supplements as recommended by your health care provider. Do not drink alcohol if: Your health care provider tells you not to drink. You are pregnant, may be pregnant, or are planning to become pregnant. If you drink alcohol: Limit how much you have to 0-1 drink a day. Be aware of how much alcohol is in your drink. In the [place] one drink equals one 12 oz bottle of beer (355 mL), one 5 oz glass of wine (148 mL), or one 1 oz glass of hard liquor (44 mL). Lifestyle Take daily care of your teeth and gums. Brush your teeth every morning and night with fluoride toothpaste. Floss one time each day. Stay active. Exercise for at least 30 minutes 5 or more days each week. Do not use any products that contain nicotine or tobacco, such as cigarettes, e-cigarettes, and chewing tobacco. If you need help quitting, ask your health care provider. Do not use drugs. If you are sexually active, practice safe sex. Use a condom or other form of protection to prevent STIs (sexually transmitted infections). If you do not wish to become pregnant, use a form of birth control. If you plan to become pregnant, see your health care provider for a prepregnancy visit. If told by your health care provider, take low-dose aspirin daily starting at age 73. Find healthy ways to cope with stress, such as: Meditation, yoga, or listening to music. Journaling. Talking to a trusted person. Spending time with friends and family. Safety Always wear your seat belt while driving or riding in a vehicle. Do not drive: If you have been drinking alcohol. Do not ride with someone who has been drinking. When you are tired or distracted. While  texting. Wear a helmet and other protective equipment during sports activities. If you have firearms in your house, make sure you follow all gun safety procedures. What's next? Visit your health care provider once a year for an annual wellness visit. Ask your health care provider how often you should have your eyes and teeth checked. Stay up to date on all vaccines. This information is not intended to replace advice given to you by your health care provider. Make sure you discuss any questions you have with your health care provider. Document Revised: 12/19/2020 Document Reviewed: 06/22/2018 Elsevier Patient Education  2022 [employer].

## 2021-08-07 ENCOUNTER — Other Ambulatory Visit: Payer: Self-pay

## 2021-08-07 ENCOUNTER — Encounter: Payer: Self-pay | Admitting: Obstetrics and Gynecology

## 2021-08-07 ENCOUNTER — Ambulatory Visit: Payer: BC Managed Care – PPO | Admitting: Obstetrics and Gynecology

## 2021-08-07 VITALS — BP 144/80 | Ht 64.0 in | Wt 202.0 lb

## 2021-08-07 DIAGNOSIS — Z124 Encounter for screening for malignant neoplasm of cervix: Secondary | ICD-10-CM

## 2021-08-07 DIAGNOSIS — Z1151 Encounter for screening for human papillomavirus (HPV): Secondary | ICD-10-CM | POA: Diagnosis not present

## 2021-08-07 DIAGNOSIS — N644 Mastodynia: Secondary | ICD-10-CM

## 2021-08-07 DIAGNOSIS — R87610 Atypical squamous cells of undetermined significance on cytologic smear of cervix (ASC-US): Secondary | ICD-10-CM | POA: Insufficient documentation

## 2021-08-07 DIAGNOSIS — Z30431 Encounter for routine checking of intrauterine contraceptive device: Secondary | ICD-10-CM

## 2021-08-07 DIAGNOSIS — R87810 Cervical high risk human papillomavirus (HPV) DNA test positive: Secondary | ICD-10-CM

## 2021-08-07 NOTE — Progress Notes (Signed)
Laura Grana, Laura Watson   Chief Complaint  Patient presents with   Follow-up    IUD     HPI:      Laura Watson is a 40 y.o. [phone] whose LMP was No LMP recorded. (Menstrual status: IUD)., presents today for IUD f/u. Mirena placed 11/15; pt unaware of 8 yr indication now. Menses are absent, has occas spotting and mild dysmen. Hx of menorrhagia prior to IUD. Pt considering TL next yr when due for removal, but also likes period relief. She is sex active, no pain/bleeding.  Last pap 12/06/18 was ASCUS with pos HPV DNA; neg colpo bx 3/20; due for repeat pap in 1 yr Has mammos through PCP, last one 5/22. Gets occas ROQ breast tenderness, no mass. Drinks 1-2 caffeine drinks daily. Neg mammos since sx start.   Past Medical History:  Diagnosis Date   Anxiety    PONV (postoperative nausea and vomiting)     Past Surgical History:  Procedure Laterality Date   APPENDECTOMY  2009   BREAST REDUCTION SURGERY Bilateral 01/16/2020   Procedure: MAMMARY REDUCTION  (BREAST);  Surgeon: Wallace Going, DO;  Location: [place];  Service: Plastics;  Laterality: Bilateral;  3.5 hours   CESAREAN SECTION     x2   REDUCTION MAMMAPLASTY Bilateral 12/2019    Family History  Problem Relation Age of Onset   Diabetes Mother    Cancer Mother 80       lymphoma   Diabetes Father    Pulmonary fibrosis Father    Diabetes Sister    Diabetes Brother    Dementia Maternal Grandmother    Alzheimer's disease Maternal Grandfather    Breast cancer Neg Hx     Social History   Socioeconomic History   Marital status: Significant Other    Spouse name: Not on file   Number of children: 2   Years of education: Not on file   Highest education level: Some college, no degree  Occupational History   Not on file  Tobacco Use   Smoking status: Never   Smokeless tobacco: Never  Vaping Use   Vaping Use: Never used  Substance and Sexual Activity   Alcohol use: Yes    Comment: occ    Drug use: No   Sexual activity: Yes    Partners: Male    Birth control/protection: I.U.D.    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   Getting married Nov   Social Determinants of Health   Financial Resource Strain: Low Risk    Difficulty of Paying Living Expenses: Not hard at all  Food Insecurity: No Food Insecurity   Worried About Charity fundraiser in the Last Year: Never true   Arboriculturist in the Last Year: Never true  Transportation Needs: No Transportation Needs   Lack of Transportation (Medical): No   Lack of Transportation (Non-Medical): No  Physical Activity: Insufficiently Active   Days of Exercise per Week: 3 days   Minutes of Exercise per Session: 30 min  Stress: No Stress Concern Present   Feeling of Stress : Only a little  Social Connections: Moderately Isolated   Frequency of Communication with Friends and Family: Twice a week   Frequency of Social Gatherings with Friends and Family:  Once a week   Attends Religious Services: Never   Active Member of Clubs or Organizations: No   Attends Archivist Meetings: Never   Marital Status: Living with partner  Intimate Partner Violence: Not At Risk   Fear of Current or Ex-Partner: No   Emotionally Abused: No   Physically Abused: No   Sexually Abused: No    Outpatient Medications Prior to Visit  Medication Sig Dispense Refill   acetaminophen (TYLENOL) 325 MG tablet Take 650 mg by mouth as needed.     busPIRone (BUSPAR) 15 MG tablet Take 0.5-1 tablets (7.5-15 mg total) by mouth 2 (two) times daily as needed. 180 tablet 3   Cholecalciferol (VITAMIN D3) 50 MCG (2000 UT) capsule Take 1 capsule (2,000 Units total) by mouth daily.     escitalopram (LEXAPRO) 20 MG tablet Take 1 tablet (20 mg total) by mouth daily. 90 tablet 3    fluticasone (FLONASE) 50 MCG/ACT nasal spray Place 2 sprays into both nostrils daily. 16 g 5   ibuprofen (ADVIL,MOTRIN) 200 MG tablet Take 400 mg by mouth as needed.     levocetirizine (XYZAL) 5 MG tablet Take 1 tablet (5 mg total) by mouth every evening. 90 tablet 3   levonorgestrel (MIRENA) 20 MCG/24HR IUD 1 Intra Uterine Device by Intrauterine route continuous.     pantoprazole (PROTONIX) 40 MG tablet Take 1 tablet (40 mg total) by mouth daily. 30 tablet 0   traZODone (DESYREL) 50 MG tablet Take 1 tablet (50 mg total) by mouth at bedtime as needed for sleep. 90 tablet 3   clobetasol ointment (TEMOVATE) 2.69 % Apply 1 application topically 2 (two) times daily. (Patient not taking: Reported on 07/23/2021) 60 g 1   No facility-administered medications prior to visit.      ROS:  Review of Systems  Constitutional:  Negative for fever.  Gastrointestinal:  Negative for blood in stool, constipation, diarrhea, nausea and vomiting.  Genitourinary:  Negative for dyspareunia, dysuria, flank pain, frequency, hematuria, urgency, vaginal bleeding, vaginal discharge and vaginal pain.  Musculoskeletal:  Negative for back pain.  Skin:  Negative for rash.  BREAST: No symptoms   OBJECTIVE:   Vitals:  BP (!) 144/80   Ht 5\' 4"  (1.626 m)   Wt 202 lb (91.6 kg)   BMI 34.67 kg/m   Physical Exam Vitals reviewed.  Constitutional:      Appearance: She is well-developed.  Pulmonary:     Effort: Pulmonary effort is normal.  Genitourinary:    General: Normal vulva.     Pubic Area: No rash.      Labia:        Right: No rash, tenderness or lesion.        Left: No rash, tenderness or lesion.      Vagina: Normal. No vaginal discharge, erythema or tenderness.     Cervix: Normal.     Uterus: Normal. Not enlarged and not tender.      Adnexa: Right adnexa normal and left adnexa normal.       Right: No mass or tenderness.         Left: No mass or tenderness.       Comments: IUD STRINGS IN CX  OS Musculoskeletal:        General: Normal range of motion.     Cervical back: Normal range of motion.  Skin:    General: Skin is warm and dry.  Neurological:     General: No focal deficit present.  Mental Status: She is alert and oriented to person, place, and time.  Psychiatric:        Mood and Affect: Mood normal.        Behavior: Behavior normal.        Thought Content: Thought content normal.        Judgment: Judgment normal.    Assessment/Plan: Cervical cancer screening - Plan: Cytology - PAP, IGP, Aptima HPV  Screening for HPV (human papillomavirus) - Plan: Cytology - PAP, IGP, Aptima HPV  ASCUS with positive high risk HPV cervical - Plan: Cytology - PAP, IGP, Aptima HPV; repeat pap today. Will f/u with results.   Encounter for routine checking of intrauterine contraceptive device (IUD)--IUD strings in cx os. Due for removal 11/23. Pt to consider replacement vs TL +/- endometrial ablation. F/u next yr.  Breast tenderness--no masses, neg mammos. D/C caffeine. F/u prn.     Return if symptoms worsen or fail to improve.  Lacara Dunsworth B. Harris Kistler, Laura Watson 08/07/2021 3:54 PM

## 2021-08-13 LAB — IGP, APTIMA HPV: HPV Aptima: NEGATIVE

## 2021-09-14 ENCOUNTER — Encounter (HOSPITAL_COMMUNITY): Payer: Self-pay | Admitting: Radiology

## 2021-09-14 NOTE — Addendum Note (Signed)
Encounter addended by: Annie Paras on: 09/14/2021 10:07 AM  Actions taken: Letter saved

## 2021-10-23 ENCOUNTER — Other Ambulatory Visit: Payer: Self-pay | Admitting: Family Medicine

## 2021-10-23 DIAGNOSIS — J309 Allergic rhinitis, unspecified: Secondary | ICD-10-CM

## 2021-10-24 ENCOUNTER — Other Ambulatory Visit: Payer: Self-pay | Admitting: Family Medicine

## 2021-10-24 DIAGNOSIS — J309 Allergic rhinitis, unspecified: Secondary | ICD-10-CM

## 2021-10-24 NOTE — Telephone Encounter (Signed)
Requested Prescriptions  Pending Prescriptions Disp Refills   levocetirizine (XYZAL) 5 MG tablet [Pharmacy Med Name: LEVOCETIRIZINE 5MG  TABLETS] 90 tablet 3    Sig: TAKE 1 TABLET(5 MG) BY MOUTH EVERY EVENING     Ear, Nose, and Throat:  Antihistamines Passed - 10/24/2021  7:09 AM      Passed - Valid encounter within last 12 months    Recent Outpatient Visits          3 months ago Annual physical exam   [facility] Delsa Grana, PA-C   6 months ago Current moderate episode of major depressive disorder without prior episode [facility])   [facility], Kristeen Miss, PA-C   8 months ago Right upper quadrant abdominal pain   [facility] Delsa Grana, PA-C   1 year ago Need for influenza vaccination   [facility] Delsa Grana, PA-C   1 year ago Current moderate episode of major depressive disorder without prior episode [facility])   [facility] Delsa Grana, [place]

## 2021-11-26 DIAGNOSIS — F338 Other recurrent depressive disorders: Secondary | ICD-10-CM | POA: Diagnosis not present

## 2021-11-26 DIAGNOSIS — F419 Anxiety disorder, unspecified: Secondary | ICD-10-CM | POA: Diagnosis not present

## 2021-11-26 DIAGNOSIS — R41840 Attention and concentration deficit: Secondary | ICD-10-CM | POA: Diagnosis not present

## 2021-11-27 DIAGNOSIS — R41840 Attention and concentration deficit: Secondary | ICD-10-CM | POA: Diagnosis not present

## 2022-01-15 DIAGNOSIS — Z79899 Other long term (current) drug therapy: Secondary | ICD-10-CM | POA: Diagnosis not present

## 2022-01-15 DIAGNOSIS — F902 Attention-deficit hyperactivity disorder, combined type: Secondary | ICD-10-CM | POA: Diagnosis not present

## 2022-01-18 DIAGNOSIS — F902 Attention-deficit hyperactivity disorder, combined type: Secondary | ICD-10-CM | POA: Diagnosis not present

## 2022-01-22 ENCOUNTER — Emergency Department (INDEPENDENT_AMBULATORY_CARE_PROVIDER_SITE_OTHER): Payer: BC Managed Care – PPO

## 2022-01-22 ENCOUNTER — Emergency Department: Admit: 2022-01-22 | Discharge status: Absent without leave | Payer: Self-pay

## 2022-01-22 ENCOUNTER — Emergency Department (INDEPENDENT_AMBULATORY_CARE_PROVIDER_SITE_OTHER)
Admission: EM | Admit: 2022-01-22 | Discharge: 2022-01-22 | Disposition: A | Discharge status: Home / Self Care | Payer: BC Managed Care – PPO | Source: Home / Self Care | Attending: Family Medicine | Admitting: Family Medicine

## 2022-01-22 ENCOUNTER — Encounter: Payer: Self-pay | Admitting: Emergency Medicine

## 2022-01-22 DIAGNOSIS — H60502 Unspecified acute noninfective otitis externa, left ear: Secondary | ICD-10-CM

## 2022-01-22 DIAGNOSIS — M7751 Other enthesopathy of right foot: Secondary | ICD-10-CM | POA: Diagnosis not present

## 2022-01-22 DIAGNOSIS — M25571 Pain in right ankle and joints of right foot: Secondary | ICD-10-CM

## 2022-01-22 DIAGNOSIS — H60501 Unspecified acute noninfective otitis externa, right ear: Secondary | ICD-10-CM

## 2022-01-22 DIAGNOSIS — M7989 Other specified soft tissue disorders: Secondary | ICD-10-CM | POA: Diagnosis not present

## 2022-01-22 MED ORDER — CIPROFLOXACIN-DEXAMETHASONE 0.3-0.1 % OT SUSP: OTIC | 0 refills | Status: DC

## 2022-01-22 MED ORDER — PREDNISONE 20 MG PO TABS: ORAL_TABLET | ORAL | 0 refills | Status: DC

## 2022-01-22 NOTE — ED Triage Notes (Signed)
Increased itching to bilateral ears the last few months  ?Denies sinus congestion  ?R ear worse than last  ?R ankle swelling the last 2 weeks  - unknown injury  ?

## 2022-01-22 NOTE — ED Provider Notes (Signed)
?[facility] ? ? ? ?CSN: [id number] ?Arrival date & time: 01/22/22  1902 ? ? ?  ? ?History   ?Chief Complaint ?Chief Complaint  ?Patient presents with  ? Otalgia  ?  right  ? Ankle Pain  ?  right  ? ? ?HPI ?Laura Watson is a 41 y.o. female.  ? ?Patient presents with two complaints: ?1)  For about two months she has had itching in both ears.  During the past week she has increased itching in the right ear, now becoming painful.  She denies nasal congestion, ear drainage, and fever.  She denies recent swimming. ?2)  For about a month she has had swelling in the mid-lateral aspect of her right ankle.  She denies recent injury or changes in physical activities. ? ?The history is provided by the patient.  ?Otalgia ?Location:  Bilateral ?Behind ear:  No abnormality ?Quality: itching. ?Onset quality:  Gradual ?Duration:  2 months ?Timing:  Constant ?Progression:  Worsening ?Chronicity:  Chronic ?Context: not direct blow, not foreign body in ear, not recent URI and not water in ear   ?Relieved by:  Nothing ?Worsened by:  Nothing ?Ineffective treatments:  None tried ?Associated symptoms: no congestion, no ear discharge, no fever, no headaches, no hearing loss, no neck pain, no rash and no sore throat   ?Ankle Pain ?Location:  Ankle ?Time since incident:  1 month ?Injury: no   ?Ankle location:  R ankle ?Pain details:  ?  Quality:  Aching ?  Severity:  Mild ?  Onset quality:  Gradual ?  Duration:  1 month ?  Timing:  Constant ?  Progression:  Worsening ?Chronicity:  New ?Prior injury to area:  No ?Relieved by:  Rest ?Worsened by:  Bearing weight and activity ?Ineffective treatments:  Ice ?Associated symptoms: swelling   ?Associated symptoms: no decreased ROM, no fatigue, no fever, no muscle weakness, no neck pain and no stiffness   ? ?Past Medical History:  ?Diagnosis Date  ? Anxiety   ? PONV (postoperative nausea and vomiting)   ? ? ?Patient Active Problem List  ? Diagnosis Date Noted  ? ASCUS with positive high  risk HPV cervical 08/07/2021  ? Class 1 obesity with body mass index (BMI) of 34.0 to 34.9 in adult 04/09/2021  ? Vitamin D deficiency 04/09/2021  ? Anxiety disorder 01/16/2018  ? Current moderate episode of major depressive disorder without prior episode ([place]) 01/16/2018  ? Family history of diabetes mellitus 01/16/2018  ? ? ?Past Surgical History:  ?Procedure Laterality Date  ? APPENDECTOMY  2009  ? BREAST REDUCTION SURGERY Bilateral 01/16/2020  ? Procedure: MAMMARY REDUCTION  (BREAST);  Surgeon: Wallace Going, DO;  Location: [place];  Service: Plastics;  Laterality: Bilateral;  3.5 hours  ? CESAREAN SECTION    ? x2  ? REDUCTION MAMMAPLASTY Bilateral 12/2019  ? ? ?OB History   ? ? Gravida  ?2  ? Para  ?2  ? Term  ?2  ? Preterm  ?   ? AB  ?   ? Living  ?2  ?  ? ? SAB  ?   ? IAB  ?   ? Ectopic  ?   ? Multiple  ?   ? Live Births  ?2  ?   ?  ?  ? ? ? ?Home Medications   ? ?Prior to Admission medications   ?Medication Sig Start Date End Date Taking? Authorizing Provider  ?ciprofloxacin-dexamethasone (CIPRODEX) OTIC suspension Place  4 gtts in each ear BID for 7 days 01/22/22  Yes Kandra Nicolas, MD  ?predniSONE (DELTASONE) 20 MG tablet Take one tab by mouth twice daily for 4 days, then one daily for 3 days. Take with food. 01/22/22  Yes Kandra Nicolas, MD  ?acetaminophen (TYLENOL) 325 MG tablet Take 650 mg by mouth as needed.    [provider]  ?busPIRone (BUSPAR) 15 MG tablet Take 0.5-1 tablets (7.5-15 mg total) by mouth 2 (two) times daily as needed. 04/08/21   Delsa Grana, PA-C  ?Cholecalciferol (VITAMIN D3) 50 MCG (2000 UT) capsule Take 1 capsule (2,000 Units total) by mouth daily. 07/23/21   Delsa Grana, PA-C  ?escitalopram (LEXAPRO) 20 MG tablet Take 1 tablet (20 mg total) by mouth daily. 04/08/21   Delsa Grana, PA-C  ?fluticasone (FLONASE) 50 MCG/ACT nasal spray SHAKE LIQUID AND USE 2 SPRAYS IN [facility] NOSTRIL DAILY 10/23/21   Delsa Grana, PA-C  ?ibuprofen (ADVIL,MOTRIN) 200 MG  tablet Take 400 mg by mouth as needed.    [provider]  ?levocetirizine (XYZAL) 5 MG tablet TAKE 1 TABLET(5 MG) BY MOUTH EVERY EVENING 10/24/21   Delsa Grana, PA-C  ?levonorgestrel (MIRENA) 20 MCG/24HR IUD 1 Intra Uterine Device by Intrauterine route continuous.    [provider]  ?pantoprazole (PROTONIX) 40 MG tablet Take 1 tablet (40 mg total) by mouth daily. ?Patient not taking: Reported on 01/22/2022 07/23/21   Delsa Grana, PA-C  ?traZODone (DESYREL) 50 MG tablet Take 1 tablet (50 mg total) by mouth at bedtime as needed for sleep. 10/07/20   Delsa Grana, PA-C  ?VYVANSE 20 MG capsule Take 20 mg by mouth every morning. 01/15/22   [provider]  ? ? ?Family History ?Family History  ?Problem Relation Age of Onset  ? Diabetes Mother   ? Cancer Mother 59  ?     lymphoma  ? Diabetes Father   ? Pulmonary fibrosis Father   ? Diabetes Sister   ? Diabetes Brother   ? Dementia Maternal Grandmother   ? Alzheimer's disease Maternal Grandfather   ? Breast cancer Neg Hx   ? ? ?Social History ?Social History  ? ?Tobacco Use  ? Smoking status: Never  ? Smokeless tobacco: Never  ?Vaping Use  ? Vaping Use: Never used  ?Substance Use Topics  ? Alcohol use: Yes  ?  Comment: rare  ? Drug use: No  ? ? ? ?Allergies   ?Patient has no known allergies. ? ? ?Review of Systems ?Review of Systems  ?Constitutional:  Negative for activity change, chills, diaphoresis, fatigue and fever.  ?HENT:  Positive for ear pain. Negative for congestion, ear discharge, facial swelling, hearing loss, sinus pressure and sore throat.   ?Eyes: Negative.   ?Respiratory: Negative.    ?Cardiovascular: Negative.   ?Gastrointestinal: Negative.   ?Genitourinary: Negative.   ?Musculoskeletal:  Negative for neck pain and stiffness.  ?     Right ankle swelling  ?Skin:  Negative for rash.  ?Neurological:  Negative for headaches.  ?Hematological:  Negative for adenopathy.  ? ? ?Physical Exam ?Triage Vital Signs ?ED Triage Vitals  ?Enc  Vitals Group  ?   BP 01/22/22 1932 132/85  ?   Pulse Rate 01/22/22 1932 94  ?   Resp 01/22/22 1932 15  ?   Temp 01/22/22 1932 97.8 ?F (36.6 ?C)  ?   Temp Source 01/22/22 1932 Oral  ?   SpO2 01/22/22 1932 98 %  ?   Weight 01/22/22 1936  190 lb (86.2 kg)  ?   Height 01/22/22 1936 '5\' 4"'$  (1.626 m)  ?   Head Circumference --   ?   Peak Flow --   ?   Pain Score 01/22/22 1935 3  ?   Pain Loc --   ?   Pain Edu? --   ?   Excl. in [place]? --   ? ?No data found. ? ?Updated Vital Signs ?BP 132/85 (BP Location: Right Arm)   Pulse 94   Temp 97.8 ?F (36.6 ?C) (Oral)   Resp 15   Ht '5\' 4"'$  (1.626 m)   Wt 86.2 kg   SpO2 98%   BMI 32.61 kg/m?  ? ?Visual Acuity ?Right Eye Distance:   ?Left Eye Distance:   ?Bilateral Distance:   ? ?Right Eye Near:   ?Left Eye Near:    ?Bilateral Near:    ? ?Physical Exam ?Constitutional:   ?   General: She is not in acute distress. ?HENT:  ?   Head: Normocephalic.  ?   Right Ear: Tympanic membrane and external ear normal. Swelling and tenderness present. There is no impacted cerumen.  ?   Left Ear: Tympanic membrane and external ear normal. There is no impacted cerumen.  ?   Ears:  ?   Comments: Right distal canal is mildly swollen, erythematous, and tender to insertion of speculum.  No discharge present. ?Left canal appears normal but has mild tenderness to palpation with insertion of speculum. ?   Nose: Nose normal.  ?   Mouth/Throat:  ?   Mouth: Mucous membranes are moist.  ?   Pharynx: Oropharynx is clear.  ?Eyes:  ?   Extraocular Movements: Extraocular movements intact.  ?   Conjunctiva/sclera: Conjunctivae normal.  ?   Pupils: Pupils are equal, round, and reactive to light.  ?Cardiovascular:  ?   Rate and Rhythm: Normal rate.  ?Pulmonary:  ?   Effort: Pulmonary effort is normal.  ?Musculoskeletal:  ?   Right ankle: Swelling present. No deformity. Tenderness present. Normal range of motion.  ?   Right Achilles Tendon: Normal.  ?     Feet: ? ?   Comments: Right anterior/lateral ankle has mild  swelling and tenderness to palpation generally over the area of the extensor retinaculum and peroneus tertius tendon.  Pain is elicited by palpating the area during resisted dorsiflexion of the ankle  ?Skin: ?   Gener

## 2022-01-22 NOTE — Discharge Instructions (Signed)
Wear ace wrap daytime for about one week until improved.  Apply ice pack for 10 to 15 minutes, 3 to 4 times daily  Continue until pain and swelling decrease.  ?After finishing prednisone, may take Ibuprofen '200mg'$ , 4 tabs every 8 hours with food for several days. ?

## 2022-01-27 ENCOUNTER — Encounter: Payer: Self-pay | Admitting: Family Medicine

## 2022-01-27 ENCOUNTER — Ambulatory Visit (INDEPENDENT_AMBULATORY_CARE_PROVIDER_SITE_OTHER): Payer: BC Managed Care – PPO | Admitting: Family Medicine

## 2022-01-27 VITALS — BP 124/76 | HR 87 | Temp 98.2°F | Resp 16 | Ht 64.0 in | Wt 196.4 lb

## 2022-01-27 DIAGNOSIS — F321 Major depressive disorder, single episode, moderate: Secondary | ICD-10-CM

## 2022-01-27 DIAGNOSIS — F419 Anxiety disorder, unspecified: Secondary | ICD-10-CM

## 2022-01-27 DIAGNOSIS — M25571 Pain in right ankle and joints of right foot: Secondary | ICD-10-CM | POA: Diagnosis not present

## 2022-01-27 DIAGNOSIS — F988 Other specified behavioral and emotional disorders with onset usually occurring in childhood and adolescence: Secondary | ICD-10-CM | POA: Diagnosis not present

## 2022-01-27 DIAGNOSIS — K219 Gastro-esophageal reflux disease without esophagitis: Secondary | ICD-10-CM | POA: Diagnosis not present

## 2022-01-27 DIAGNOSIS — H9202 Otalgia, left ear: Secondary | ICD-10-CM

## 2022-01-27 DIAGNOSIS — G4700 Insomnia, unspecified: Secondary | ICD-10-CM

## 2022-01-27 DIAGNOSIS — J309 Allergic rhinitis, unspecified: Secondary | ICD-10-CM | POA: Insufficient documentation

## 2022-01-27 MED ORDER — LEVOCETIRIZINE DIHYDROCHLORIDE 5 MG PO TABS: ORAL_TABLET | ORAL | 3 refills | Status: DC

## 2022-01-27 MED ORDER — BUSPIRONE HCL 15 MG PO TABS: 7.5000 mg | ORAL_TABLET | Freq: Two times a day (BID) | ORAL | 3 refills | Status: DC | PRN

## 2022-01-27 MED ORDER — ESCITALOPRAM OXALATE 20 MG PO TABS: 20.0000 mg | ORAL_TABLET | Freq: Every day | ORAL | 3 refills | Status: DC

## 2022-01-27 MED ORDER — NEOMYCIN-POLYMYXIN-HC 3.5-10000-1 OT SOLN: 4.0000 [drp] | Freq: Four times a day (QID) | OTIC | 1 refills | Status: AC

## 2022-01-27 MED ORDER — TRAZODONE HCL 50 MG PO TABS: 50.0000 mg | ORAL_TABLET | Freq: Every evening | ORAL | 3 refills | Status: DC | PRN

## 2022-01-27 NOTE — Progress Notes (Signed)
? ? ?Patient ID: Laura Watson, female    DOB: 07-Sep-1981, 41 y.o.   MRN: [id number] ? ?PCP: Delsa Grana, PA-C ? ?Chief Complaint  ?Patient presents with  ? Ear Pain  ?  Left, went to urgent care last friday dx w/ ear infection on right ear given antibiotic drops.  Right ear cleared up but now left ear hurts has been using drops but not helping.  ? ? ?Subjective:  ? ?Laura Watson is a 41 y.o. female, presents to clinic with CC of the following: ? ?HPI  ?F/up on ear infection - outer ear seen by [facility] given antibiotic drops  ?She has improved pain to right ear, but left ear still bothersome, had some fluid and bloody discharge, has used qtip ?Sx started after a lot of ear itching ?Overall feels better ?On allergy meds xyzal and flonase ? ?MDD/anxiety well controlled  ? ?  01/27/2022  ?  2:27 PM 07/23/2021  ?  8:50 AM 04/08/2021  ?  3:29 PM  ?Depression screen PHQ 2/9  ?Decreased Interest 0 0 0  ?Down, Depressed, Hopeless 0 0 0  ?PHQ - 2 Score 0 0 0  ?Altered sleeping 0 0 1  ?Tired, decreased energy 0 0 1  ?Change in appetite 0 0 0  ?Feeling bad or failure about yourself  0 0 0  ?Trouble concentrating 0 0 3  ?Moving slowly or fidgety/restless 0 0 3  ?Suicidal thoughts 0 0 0  ?PHQ-9 Score 0 0 8  ?Difficult doing work/chores Not difficult at all Not difficult at all Somewhat difficult  ? ? ?  04/08/2021  ?  3:30 PM 10/07/2020  ?  8:10 AM 04/07/2020  ?  8:24 AM 01/16/2018  ?  2:15 PM  ?GAD 7 : Generalized Anxiety Score  ?Nervous, Anxious, on Edge 0 0 1 3  ?Control/stop worrying 1 0 0 2  ?Worry too much - different things 1 0 0 2  ?Trouble relaxing 1 1 0 3  ?Restless 1 0 1 3  ?Easily annoyed or irritable 0 '1 1 3  '$ ?Afraid - awful might happen 0 0 0 2  ?Total GAD 7 Score '4 2 3 18  '$ ?Anxiety Difficulty  Somewhat difficult Not difficult at all Very difficult  ? ?Lexapro 20 mg daily and buspar 1-2 tabs prn, not using every day  ? ?Sleep - trazodone prn - overall good sleep ? ?Vyvanse  20 mg- doing well on meds, work has been much  better - following with [facility] ,sleeping well, anxiety is improved, no problems with weights, shes eating normally, maybe less snacking ? ?Ankle pain onset a few months ago outer right ankle swelling and sore but no injury ?Got a brace from UC but is pokes her and hasn't helped much ? ? ? ? ? ? ?Patient Active Problem List  ? Diagnosis Date Noted  ? ASCUS with positive high risk HPV cervical 08/07/2021  ? Class 1 obesity with body mass index (BMI) of 34.0 to 34.9 in adult 04/09/2021  ? Vitamin D deficiency 04/09/2021  ? Anxiety disorder 01/16/2018  ? Current moderate episode of major depressive disorder without prior episode ([place]) 01/16/2018  ? Family history of diabetes mellitus 01/16/2018  ? ? ? ? ?Current Outpatient Medications:  ?  acetaminophen (TYLENOL) 325 MG tablet, Take 650 mg by mouth as needed., Disp: , Rfl:  ?  busPIRone (BUSPAR) 15 MG tablet, Take 0.5-1 tablets (7.5-15 mg total) by mouth 2 (two) times daily  as needed., Disp: 180 tablet, Rfl: 3 ?  Cholecalciferol (VITAMIN D3) 50 MCG (2000 UT) capsule, Take 1 capsule (2,000 Units total) by mouth daily., Disp: , Rfl:  ?  ciprofloxacin-dexamethasone (CIPRODEX) OTIC suspension, Place 4 gtts in each ear BID for 7 days, Disp: 7.5 mL, Rfl: 0 ?  escitalopram (LEXAPRO) 20 MG tablet, Take 1 tablet (20 mg total) by mouth daily., Disp: 90 tablet, Rfl: 3 ?  fluticasone (FLONASE) 50 MCG/ACT nasal spray, SHAKE LIQUID AND USE 2 SPRAYS IN EACH NOSTRIL DAILY, Disp: 16 g, Rfl: 5 ?  ibuprofen (ADVIL,MOTRIN) 200 MG tablet, Take 400 mg by mouth as needed., Disp: , Rfl:  ?  levocetirizine (XYZAL) 5 MG tablet, TAKE 1 TABLET(5 MG) BY MOUTH EVERY EVENING, Disp: 90 tablet, Rfl: 3 ?  levonorgestrel (MIRENA) 20 MCG/24HR IUD, 1 Intra Uterine Device by Intrauterine route continuous., Disp: , Rfl:  ?  pantoprazole (PROTONIX) 40 MG tablet, Take 1 tablet (40 mg total) by mouth daily., Disp: 30 tablet, Rfl: 0 ?  traZODone (DESYREL) 50 MG tablet, Take 1 tablet (50  mg total) by mouth at bedtime as needed for sleep., Disp: 90 tablet, Rfl: 3 ?  VYVANSE 20 MG capsule, Take 20 mg by mouth every morning., Disp: , Rfl:  ?  predniSONE (DELTASONE) 20 MG tablet, Take one tab by mouth twice daily for 4 days, then one daily for 3 days. Take with food. (Patient not taking: Reported on 01/27/2022), Disp: 11 tablet, Rfl: 0 ? ? ?No Known Allergies ? ? ?Social History  ? ?Tobacco Use  ? Smoking status: Never  ? Smokeless tobacco: Never  ?Vaping Use  ? Vaping Use: Never used  ?Substance Use Topics  ? Alcohol use: Yes  ?  Comment: rare  ? Drug use: No  ?  ? ? ?Chart Review Today: ?I personally reviewed active problem list, medication list, allergies, family history, social history, health maintenance, notes from last encounter, lab results, imaging with the patient/caregiver today. ? ? ?Review of Systems  ?Constitutional: Negative.   ?HENT: Negative.    ?Eyes: Negative.   ?Respiratory: Negative.    ?Cardiovascular: Negative.   ?Gastrointestinal: Negative.   ?Endocrine: Negative.   ?Genitourinary: Negative.   ?Musculoskeletal: Negative.   ?Skin: Negative.   ?Allergic/Immunologic: Negative.   ?Neurological: Negative.   ?Hematological: Negative.   ?Psychiatric/Behavioral: Negative.    ?All other systems reviewed and are negative. ? ?   ?Objective:  ? ?Vitals:  ? 01/27/22 1424  ?BP: 124/76  ?Pulse: 87  ?Resp: 16  ?Temp: 98.2 ?F (36.8 ?C)  ?SpO2: 98%  ?Weight: 196 lb 6.4 oz (89.1 kg)  ?Height: '5\' 4"'$  (1.626 m)  ?  ?Body mass index is 33.71 kg/m?. ? ?Physical Exam ?Vitals and nursing note reviewed.  ?Constitutional:   ?   Appearance: She is well-developed.  ?HENT:  ?   Head: Normocephalic and atraumatic.  ?   Right Ear: Tympanic membrane and external ear normal. No tenderness. No middle ear effusion. There is no impacted cerumen.  ?   Left Ear: Tympanic membrane and external ear normal. Swelling (with some erythema) present. No drainage or tenderness.  No middle ear effusion.  ?   Nose: Nose normal. No  nasal tenderness, mucosal edema, congestion or rhinorrhea.  ?   Right Turbinates: Enlarged and swollen.  ?   Left Turbinates: Enlarged and swollen.  ?   Mouth/Throat:  ?   Mouth: Mucous membranes are dry.  ?Eyes:  ?   General:     ?  Right eye: No discharge.     ?   Left eye: No discharge.  ?   Conjunctiva/sclera: Conjunctivae normal.  ?Neck:  ?   Trachea: Trachea and phonation normal. No tracheal deviation.  ?Cardiovascular:  ?   Rate and Rhythm: Normal rate and regular rhythm.  ?   Pulses: Normal pulses.  ?   Heart sounds: Normal heart sounds.  ?Pulmonary:  ?   Effort: Pulmonary effort is normal. No respiratory distress.  ?   Breath sounds: Normal breath sounds. No stridor.  ?Musculoskeletal:     ?   General: Normal range of motion.  ?   Right ankle: Swelling present.  ?   Comments: Right lateral malleolus swelling/edema mild ttp  ?Lymphadenopathy:  ?   Head:  ?   Right side of head: No submental, submandibular, tonsillar, preauricular or posterior auricular adenopathy.  ?   Left side of head: No submental, submandibular, tonsillar, preauricular or posterior auricular adenopathy.  ?   Cervical: No cervical adenopathy.  ?Skin: ?   General: Skin is warm and dry.  ?   Findings: No rash.  ?Neurological:  ?   Mental Status: She is alert.  ?   Motor: No abnormal muscle tone.  ?   Coordination: Coordination normal.  ?Psychiatric:     ?   Behavior: Behavior normal.  ?  ? ?Results for orders placed or performed in visit on 08/07/21  ?IGP, Aptima HPV  ?Result Value Ref Range  ? Interpretation NILM   ? Category NIL   ? Adequacy SECNI   ? Clinician Provided ICD10 Comment   ? Performed by: Comment   ? Note: Comment   ? Test Methodology Comment   ? HPV Aptima Negative Negative  ? ? ?   ?Assessment & Plan:  ? ?1. Acute otalgia, left ?Left TM erythematous and mildly edematous ?No q-tips only irrigate ear if irritated ?After she finishes ciprodex can use cortisporin or OTC swimmers ear acidic drops to reduce itching/swelling and  chance for another outer ear infection ?No tragus ttp, no discharge TM normal ?Expect it to resolve soon ?- neomycin-polymyxin-hydrocortisone (CORTISPORIN) OTIC solution; Place 4 drops into both ears 4 (

## 2022-04-09 IMAGING — DX DG ANKLE COMPLETE 3+V*R*
3 series · 3 of 3 positions shown · non-contrast
Comparison: None.

CLINICAL DATA: Intermittent swelling, pain

EXAM:
RIGHT ANKLE - COMPLETE 3+ VIEW

[ankle ap]
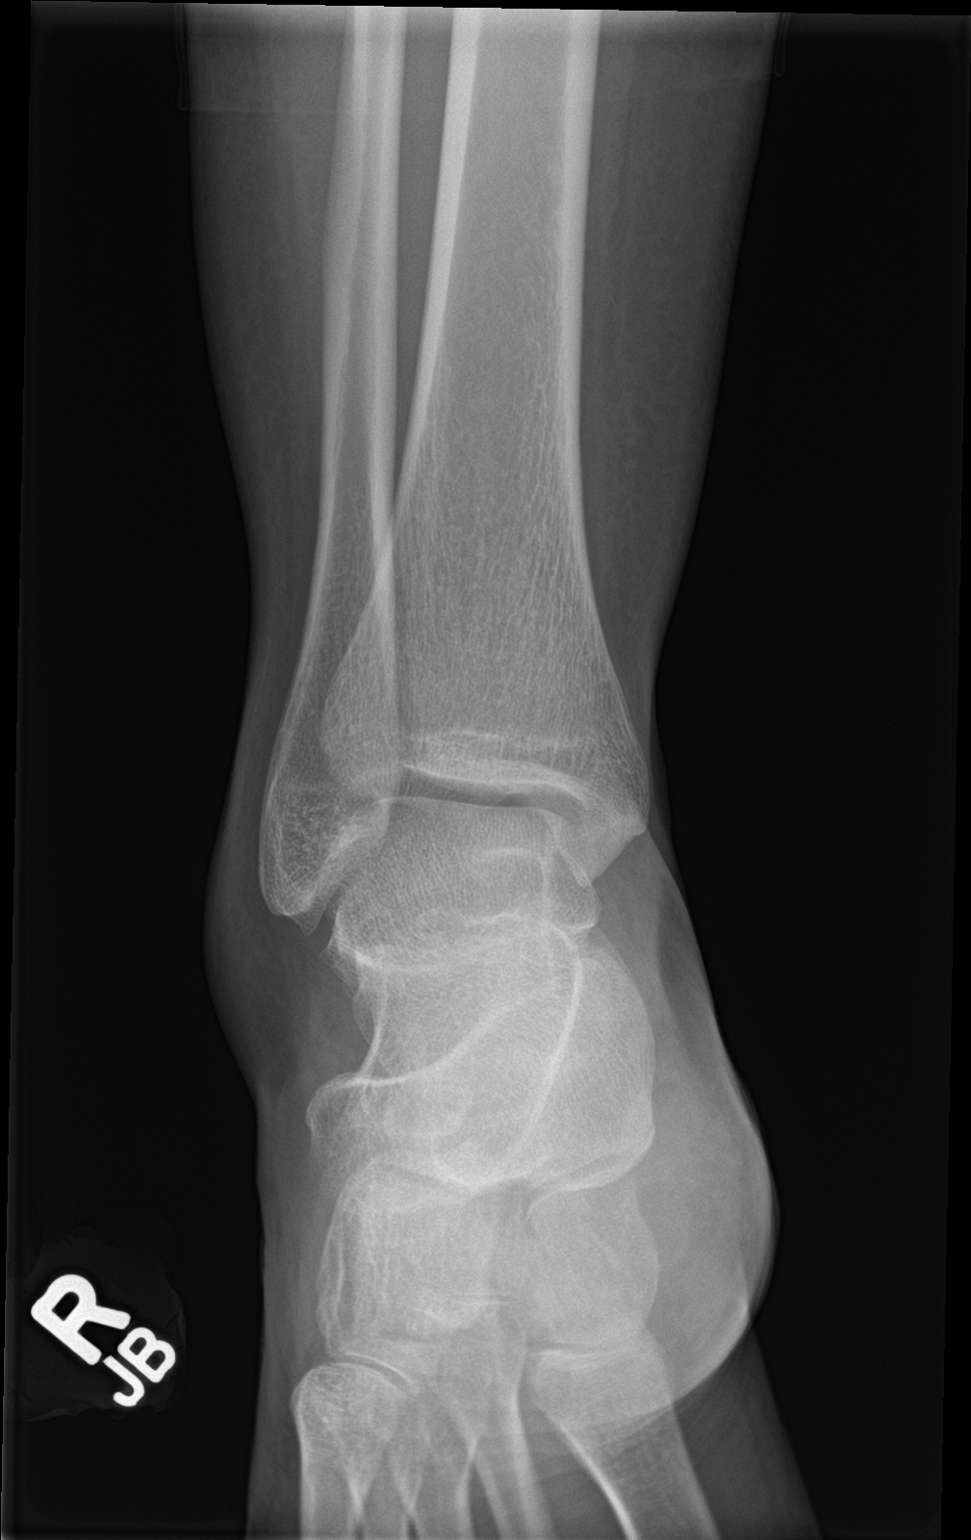

[ankle obl]
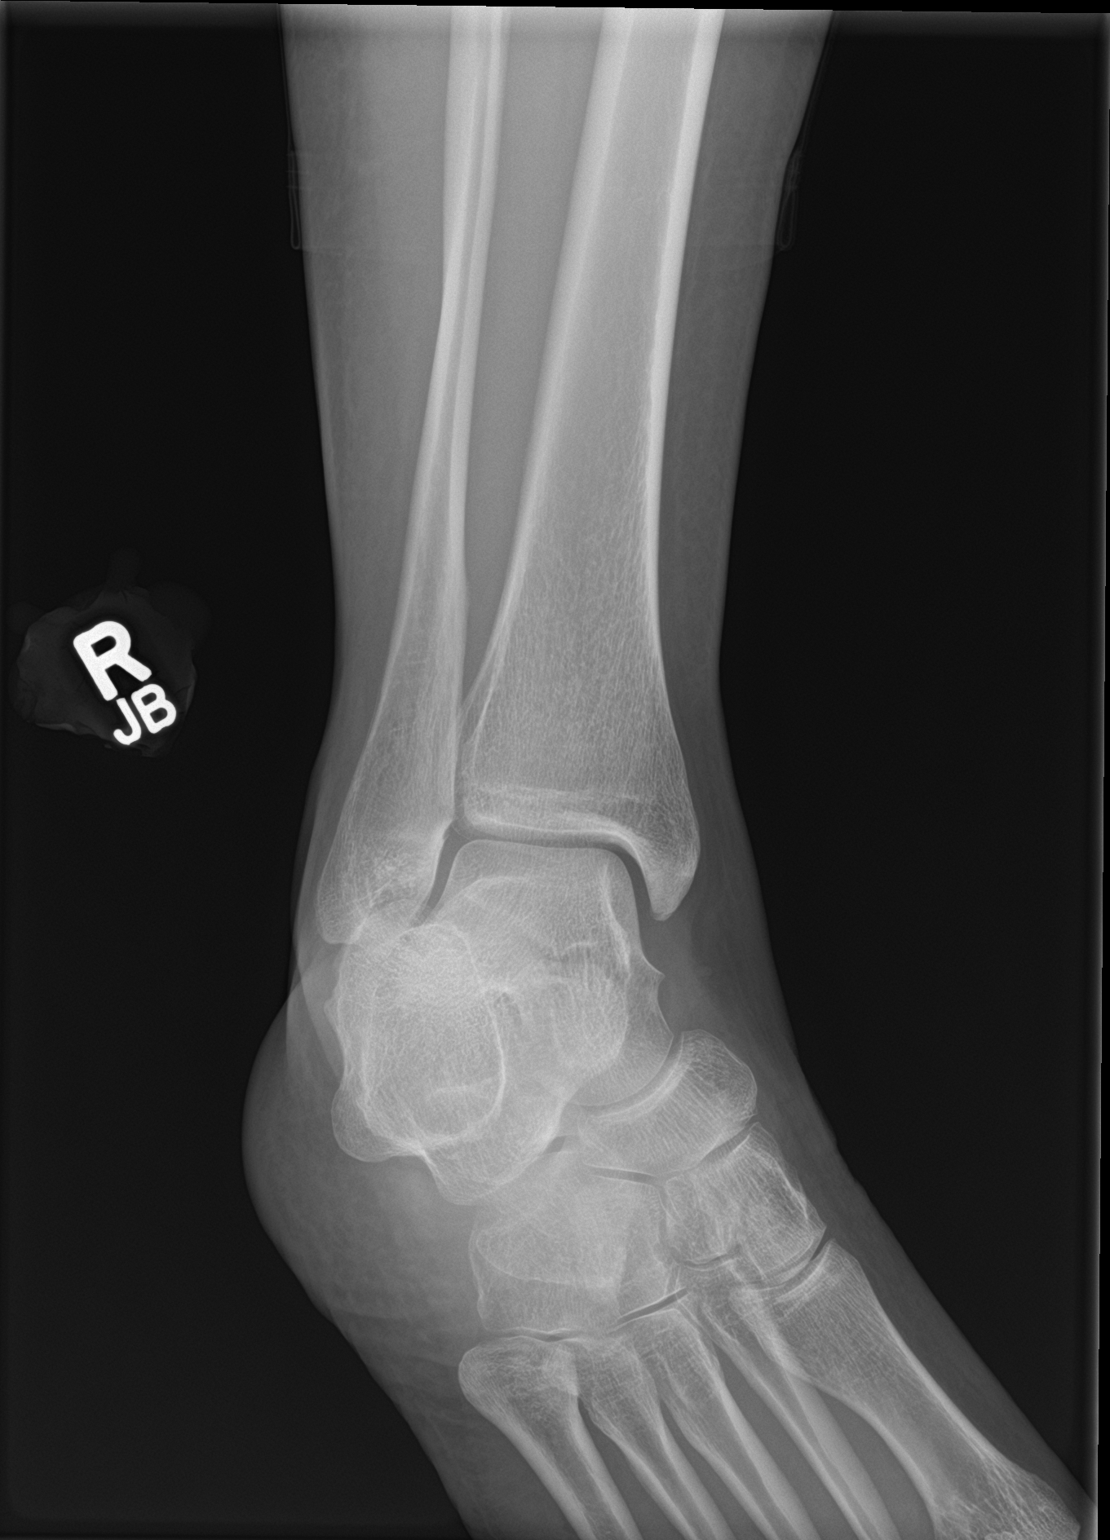

[ankle lat]
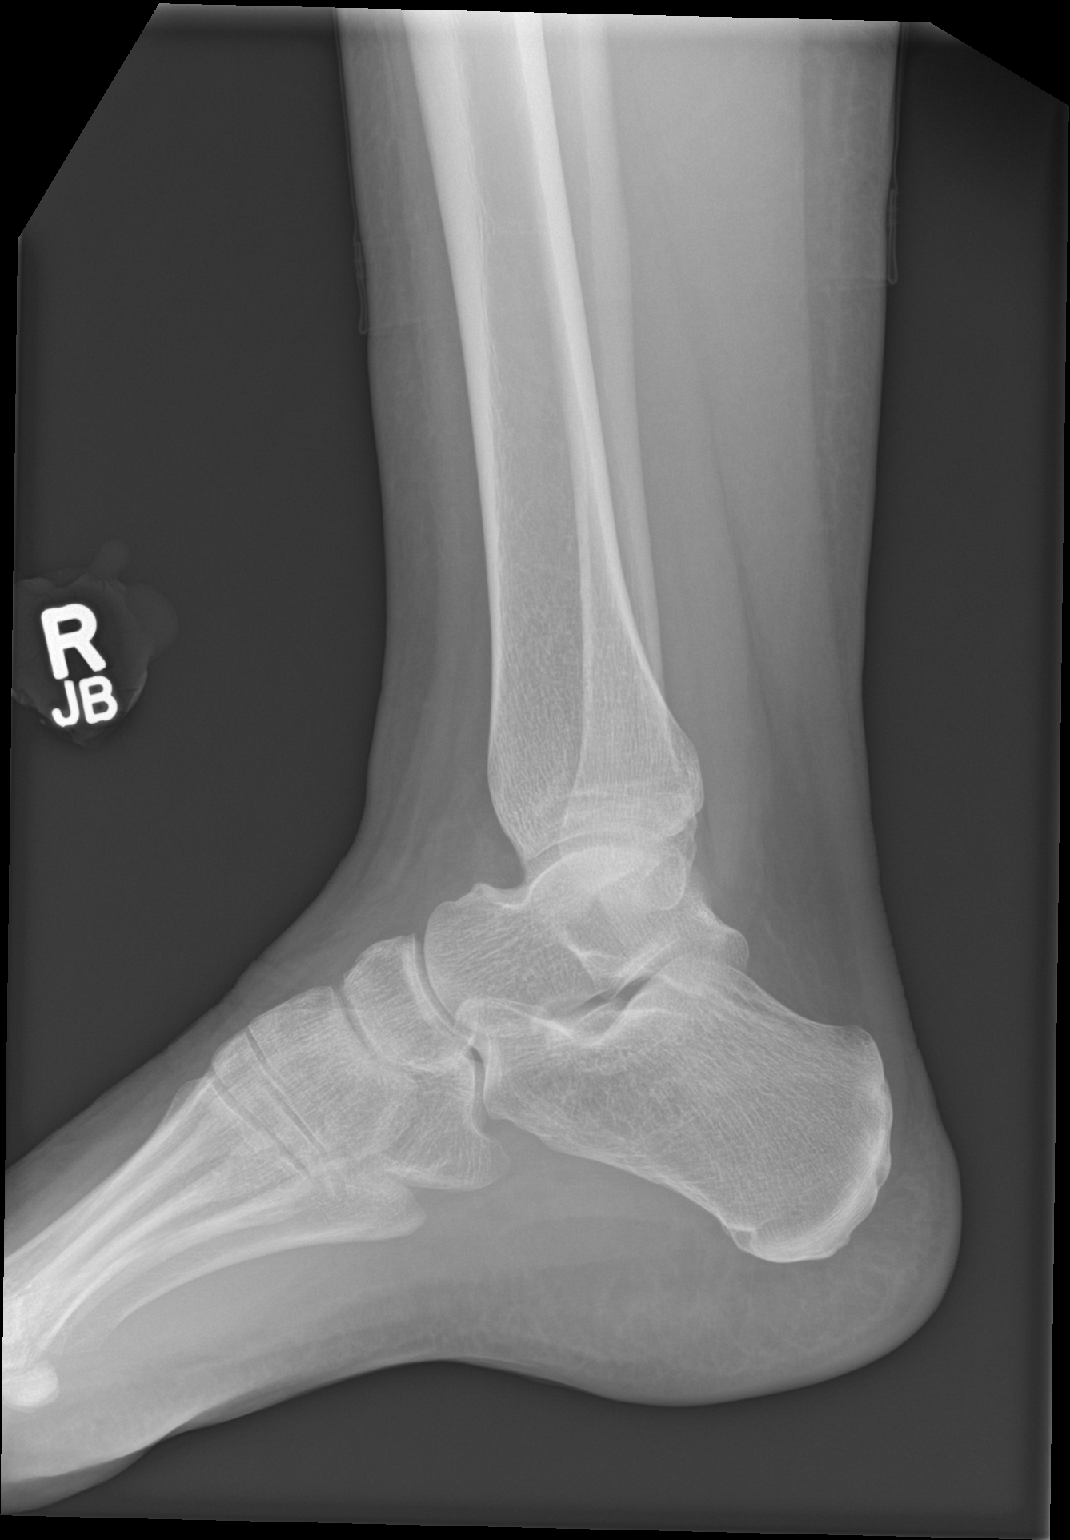

[3 of 3 positions shown; findings below may reference images not displayed]

FINDINGS: There is no evidence of fracture, dislocation, or joint effusion.
There is no evidence of arthropathy or other focal bone abnormality.
Mild soft tissue swelling about the lateral ankle.
IMPRESSION: No acute osseous abnormality.

## 2022-04-16 DIAGNOSIS — F338 Other recurrent depressive disorders: Secondary | ICD-10-CM | POA: Diagnosis not present

## 2022-04-16 DIAGNOSIS — Z79899 Other long term (current) drug therapy: Secondary | ICD-10-CM | POA: Diagnosis not present

## 2022-04-16 DIAGNOSIS — F419 Anxiety disorder, unspecified: Secondary | ICD-10-CM | POA: Diagnosis not present

## 2022-04-16 DIAGNOSIS — F902 Attention-deficit hyperactivity disorder, combined type: Secondary | ICD-10-CM | POA: Diagnosis not present

## 2022-06-15 LAB — LIPID PANEL
Cholesterol: 222 — AB (ref 0–200)
HDL: 45 (ref 35–70)
LDL Cholesterol: 143
LDl/HDL Ratio: 4.9
Triglycerides: 190 — AB (ref 40–160)

## 2022-06-15 LAB — BASIC METABOLIC PANEL
Creatinine: 0.8 (ref 0.5–1.1)
Glucose: 80

## 2022-06-15 LAB — COMPREHENSIVE METABOLIC PANEL: eGFR: 103

## 2022-06-15 LAB — HEMOGLOBIN A1C: Hemoglobin A1C: 5.9

## 2022-07-01 HISTORY — PX: WISDOM TOOTH EXTRACTION: SHX21

## 2022-07-16 DIAGNOSIS — Z79899 Other long term (current) drug therapy: Secondary | ICD-10-CM | POA: Diagnosis not present

## 2022-07-16 DIAGNOSIS — F902 Attention-deficit hyperactivity disorder, combined type: Secondary | ICD-10-CM | POA: Diagnosis not present

## 2022-07-23 NOTE — Patient Instructions (Incomplete)
Preventive Care 40-42 Years Old, Female Preventive care refers to lifestyle choices and visits with your health care provider that can promote health and wellness. Preventive care visits are also called wellness exams. What can I expect for my preventive care visit? Counseling Your health care provider may ask you questions about your: Medical history, including: Past medical problems. Family medical history. Pregnancy history. Current health, including: Menstrual cycle. Method of birth control. Emotional well-being. Home life and relationship well-being. Sexual activity and sexual health. Lifestyle, including: Alcohol, nicotine or tobacco, and drug use. Access to firearms. Diet, exercise, and sleep habits. Work and work environment. Sunscreen use. Safety issues such as seatbelt and bike helmet use. Physical exam Your health care provider will check your: Height and weight. These may be used to calculate your BMI (body mass index). BMI is a measurement that tells if you are at a healthy weight. Waist circumference. This measures the distance around your waistline. This measurement also tells if you are at a healthy weight and may help predict your risk of certain diseases, such as type 2 diabetes and high blood pressure. Heart rate and blood pressure. Body temperature. Skin for abnormal spots. What immunizations do I need?  Vaccines are usually given at various ages, according to a schedule. Your health care provider will recommend vaccines for you based on your age, medical history, and lifestyle or other factors, such as travel or where you work. What tests do I need? Screening Your health care provider may recommend screening tests for certain conditions. This may include: Lipid and cholesterol levels. Diabetes screening. This is done by checking your blood sugar (glucose) after you have not eaten for a while (fasting). Pelvic exam and Pap test. Hepatitis B test. Hepatitis C  test. HIV (human immunodeficiency virus) test. STI (sexually transmitted infection) testing, if you are at risk. Lung cancer screening. Colorectal cancer screening. Mammogram. Talk with your health care provider about when you should start having regular mammograms. This may depend on whether you have a family history of breast cancer. BRCA-related cancer screening. This may be done if you have a family history of breast, ovarian, tubal, or peritoneal cancers. Bone density scan. This is done to screen for osteoporosis. Talk with your health care provider about your test results, treatment options, and if necessary, the need for more tests. Follow these instructions at home: Eating and drinking  Eat a diet that includes fresh fruits and vegetables, whole grains, lean protein, and low-fat dairy products. Take vitamin and mineral supplements as recommended by your health care provider. Do not drink alcohol if: Your health care provider tells you not to drink. You are pregnant, may be pregnant, or are planning to become pregnant. If you drink alcohol: Limit how much you have to 0-1 drink a day. Know how much alcohol is in your drink. In the [place] one drink equals one 12 oz bottle of beer (355 mL), one 5 oz glass of wine (148 mL), or one 1 oz glass of hard liquor (44 mL). Lifestyle Brush your teeth every morning and night with fluoride toothpaste. Floss one time each day. Exercise for at least 30 minutes 5 or more days each week. Do not use any products that contain nicotine or tobacco. These products include cigarettes, chewing tobacco, and vaping devices, such as e-cigarettes. If you need help quitting, ask your health care provider. Do not use drugs. If you are sexually active, practice safe sex. Use a condom or other form of protection to   prevent STIs. If you do not wish to become pregnant, use a form of birth control. If you plan to become pregnant, see your health care provider for a  prepregnancy visit. Take aspirin only as told by your health care provider. Make sure that you understand how much to take and what form to take. Work with your health care provider to find out whether it is safe and beneficial for you to take aspirin daily. Find healthy ways to manage stress, such as: Meditation, yoga, or listening to music. Journaling. Talking to a trusted person. Spending time with friends and family. Minimize exposure to UV radiation to reduce your risk of skin cancer. Safety Always wear your seat belt while driving or riding in a vehicle. Do not drive: If you have been drinking alcohol. Do not ride with someone who has been drinking. When you are tired or distracted. While texting. If you have been using any mind-altering substances or drugs. Wear a helmet and other protective equipment during sports activities. If you have firearms in your house, make sure you follow all gun safety procedures. Seek help if you have been physically or sexually abused. What's next? Visit your health care provider once a year for an annual wellness visit. Ask your health care provider how often you should have your eyes and teeth checked. Stay up to date on all vaccines. This information is not intended to replace advice given to you by your health care provider. Make sure you discuss any questions you have with your health care provider. Document Revised: 04/08/2021 Document Reviewed: 04/08/2021 Elsevier Patient Education  [place].

## 2022-07-26 ENCOUNTER — Encounter: Payer: Self-pay | Admitting: Family Medicine

## 2022-07-26 ENCOUNTER — Ambulatory Visit (INDEPENDENT_AMBULATORY_CARE_PROVIDER_SITE_OTHER): Payer: BC Managed Care – PPO | Admitting: Family Medicine

## 2022-07-26 VITALS — BP 130/78 | HR 97 | Temp 98.0°F | Resp 16 | Ht 63.0 in | Wt 189.4 lb

## 2022-07-26 DIAGNOSIS — Z0000 Encounter for general adult medical examination without abnormal findings: Secondary | ICD-10-CM

## 2022-07-26 DIAGNOSIS — D75839 Thrombocytosis, unspecified: Secondary | ICD-10-CM

## 2022-07-26 DIAGNOSIS — Z1159 Encounter for screening for other viral diseases: Secondary | ICD-10-CM

## 2022-07-26 DIAGNOSIS — R7303 Prediabetes: Secondary | ICD-10-CM

## 2022-07-26 DIAGNOSIS — Z1231 Encounter for screening mammogram for malignant neoplasm of breast: Secondary | ICD-10-CM

## 2022-07-26 DIAGNOSIS — J309 Allergic rhinitis, unspecified: Secondary | ICD-10-CM

## 2022-07-26 DIAGNOSIS — D72829 Elevated white blood cell count, unspecified: Secondary | ICD-10-CM

## 2022-07-26 DIAGNOSIS — Z23 Encounter for immunization: Secondary | ICD-10-CM

## 2022-07-26 DIAGNOSIS — F988 Other specified behavioral and emotional disorders with onset usually occurring in childhood and adolescence: Secondary | ICD-10-CM

## 2022-07-26 DIAGNOSIS — Z6833 Body mass index (BMI) 33.0-33.9, adult: Secondary | ICD-10-CM

## 2022-07-26 DIAGNOSIS — Z30431 Encounter for routine checking of intrauterine contraceptive device: Secondary | ICD-10-CM

## 2022-07-26 DIAGNOSIS — E669 Obesity, unspecified: Secondary | ICD-10-CM

## 2022-07-26 DIAGNOSIS — E785 Hyperlipidemia, unspecified: Secondary | ICD-10-CM

## 2022-07-26 MED ORDER — LISDEXAMFETAMINE DIMESYLATE 30 MG PO CAPS: 30.0000 mg | ORAL_CAPSULE | Freq: Every day | ORAL | 0 refills | Status: DC

## 2022-07-26 MED ORDER — FLUTICASONE PROPIONATE 50 MCG/ACT NA SUSP: NASAL | 5 refills | Status: DC

## 2022-07-26 NOTE — Progress Notes (Signed)
Patient: Laura Watson, Female    DOB: [birth year], 41 y.o.   MRN: [id number] Delsa Grana, PA-C Visit Date: 07/26/2022  Today's Provider: Delsa Grana, PA-C   Chief Complaint  Patient presents with   Annual Exam   Subjective:   Annual physical exam:  Laura Watson is a 41 y.o. female who presents today for complete physical exam:  Exercise/Activity:   3 d 40 min and walking extensively at work at least 3-4 miles daily  Diet/nutrition:   diet - generally pretty healthy, cutting back on portions a little bit Sleep:  falls asleep ok but wakes and has trouble getting back to sleep  Trazodone was helpful but also then caused her to be too grogy   [place]: No Food Insecurity (07/26/2022)  Housing: [place]  (07/26/2022)  Transportation Needs: No Transportation Needs (07/26/2022)  Utilities: Not At Risk (07/26/2022)  Alcohol Screen: Low Risk  (07/26/2022)  Depression (PHQ2-9): Low Risk  (07/26/2022)  Financial Resource Strain: Low Risk  (07/26/2022)  Physical Activity: Insufficiently Active (07/26/2022)  Social Connections: Moderately Isolated (07/26/2022)  Stress: No Stress Concern Present (07/26/2022)  Tobacco Use: Low Risk  (07/26/2022)   Insomnia - discussed other medications to try including hydroxyzine, over-the-counter medicines, melatonin, magnesium supplements, doing a sleep study, or even trying something like new prescription meds - Dayv[place]  USPSTF grade A and B recommendations - reviewed and addressed today  Depression:  Phq 9 completed today by patient, was reviewed by me with patient in the room PHQ score is neg, pt feels mood is good    07/26/2022    3:27 PM 01/27/2022    2:27 PM 07/23/2021    8:50 AM 04/08/2021    3:29 PM  PHQ 2/9 Scores  PHQ - 2 Score 0 0 0 0  PHQ- 9 Score 0 0 0 8      07/26/2022    3:27 PM 01/27/2022    2:27 PM 07/23/2021    8:50 AM 04/08/2021    3:29 PM 02/10/2021    2:35 PM  Depression screen PHQ 2/9  Decreased  Interest 0 0 0 0 0  Down, Depressed, Hopeless 0 0 0 0 0  PHQ - 2 Score 0 0 0 0 0  Altered sleeping 0 0 0 1   Tired, decreased energy 0 0 0 1   Change in appetite 0 0 0 0   Feeling bad or failure about yourself  0 0 0 0   Trouble concentrating 0 0 0 3   Moving slowly or fidgety/restless 0 0 0 3   Suicidal thoughts 0 0 0 0   PHQ-9 Score 0 0 0 8   Difficult doing work/chores Not difficult at all Not difficult at all Not difficult at all Somewhat difficult     Alcohol screening: [place] Office Visit from 07/26/2022 in [facility]  AUDIT-C Score 1       Immunizations and Health Maintenance: Health Maintenance  Topic Date Due   Hepatitis C Screening  Never done   COVID-19 Vaccine (3 - Pfizer series) 04/15/2020   MAMMOGRAM  02/26/2022   PAP SMEAR-Modifier  08/07/2024   TETANUS/TDAP  01/17/2029   HIV Screening  Completed   HPV VACCINES  Aged Out   INFLUENZA VACCINE  Discontinued    Hep C Screening:  DUE  STD testing and prevention (HIV/chl/gon/syphilis):  see above, no additional testing desired by pt today  Intimate partner violence:  denies feels safe  Sexual History/Pain during Intercourse: Married  Menstrual History/LMP/Abnormal Bleeding: IU Overdue some spotting, should have been replaced about 1 year ago No LMP recorded. (Menstrual status: IUD).    Incontinence Symptoms:  sneeze, otherwise no sx  Breast cancer: mammo Last Mammogram: *see HM list above BRCA gene screening: n/a  Cervical cancer screening: UTD Pt denies family hx of cancers - breast, ovarian, uterine, colon:     Osteoporosis:   Discussion on osteoporosis per age, including high calcium and vitamin D supplementation, weight bearing exercises  Vit D deficiency improved and last in normal range  Skin cancer:  Hx of skin CA -  NO Discussed atypical lesions   Colorectal cancer:   Colonoscopy is  not due Mild constipation  Discussed concerning signs and sx of CRC  Lung  cancer:   Low Dose CT Chest recommended if Age 66-80 years, 20 pack-year currently smoking OR have quit w/in 15years. Patient does not qualify.    Social History   Tobacco Use   Smoking status: Never   Smokeless tobacco: Never  Vaping Use   Vaping Use: Never used  Substance Use Topics   Alcohol use: Yes    Comment: rare   Drug use: No     Flowsheet Row Office Visit from 07/26/2022 in [facility]  AUDIT-C Score 1       Family History  Problem Relation Age of Onset   Diabetes Mother    Cancer Mother 83       lymphoma   Diabetes Father    Pulmonary fibrosis Father    Diabetes Sister    Diabetes Brother    Dementia Maternal Grandmother    Alzheimer's disease Maternal Grandfather    Breast cancer Neg Hx      Blood pressure/Hypertension: BP Readings from Last 3 Encounters:  07/26/22 130/78  01/27/22 124/76  01/22/22 132/85    Weight/Obesity: Wt Readings from Last 3 Encounters:  07/26/22 189 lb 6.4 oz (85.9 kg)  01/27/22 196 lb 6.4 oz (89.1 kg)  01/22/22 190 lb (86.2 kg)   BMI Readings from Last 3 Encounters:  07/26/22 33.55 kg/m  01/27/22 33.71 kg/m  01/22/22 32.61 kg/m     Lipids:  Lab Results  Component Value Date   CHOL 188 02/10/2021   CHOL 188 04/07/2020   CHOL 176 01/16/2018   Lab Results  Component Value Date   HDL 43 (L) 02/10/2021   HDL 41 04/07/2020   HDL 50 01/16/2018   Lab Results  Component Value Date   LDLCALC 116 (H) 02/10/2021   LDLCALC 126 (H) 04/07/2020   LDLCALC 110 (H) 01/16/2018   Lab Results  Component Value Date   TRIG 175 (H) 02/10/2021   TRIG 118 04/07/2020   TRIG 79 01/16/2018   Lab Results  Component Value Date   CHOLHDL 4.4 02/10/2021   CHOLHDL 4.6 (H) 04/07/2020   CHOLHDL 3.5 01/16/2018   No results found for: "LDLDIRECT" Based on the results of lipid panel his/her cardiovascular risk factor ( using [place] )  in the next 10 years is: The 10-year ASCVD risk score (Arnett DK, et  al., 2019) is: 0.9%   Values used to calculate the score:     Age: 62 years     Sex: Female     Is Non-Hispanic African American: No     Diabetic: No     Tobacco smoker: No     Systolic Blood Pressure: 962 mmHg  Is BP treated: No     HDL Cholesterol: 43 mg/dL     Total Cholesterol: 188 mg/dL  Glucose:  Glucose  Date Value Ref Range Status  04/07/2020 97 65 - 99 mg/dL Final  01/16/2018 75 65 - 99 mg/dL Final   Glucose, Bld  Date Value Ref Range Status  02/10/2021 74 65 - 99 mg/dL Final    Comment:    .            Fasting reference interval .   07/07/2017 89 65 - 99 mg/dL Final    Advanced Care Planning:  A voluntary discussion about advance care planning including the explanation and discussion of advance directives.     Social History       Social History   Socioeconomic History   Marital status: Married    Spouse name: Not on file   Number of children: 2   Years of education: Not on file   Highest education level: Some college, no degree  Occupational History   Not on file  Tobacco Use   Smoking status: Never   Smokeless tobacco: Never  Vaping Use   Vaping Use: Never used  Substance and Sexual Activity   Alcohol use: Yes    Comment: rare   Drug use: No   Sexual activity: Yes    Partners: Male    Birth control/protection: I.U.D.    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   Getting married Nov   Social Determinants of Health   Financial Resource Strain: Low Risk  (07/26/2022)   Overall Financial Resource Strain (CARDIA)    Difficulty of Paying Living Expenses: Not hard at all  Food Insecurity: No Food Insecurity (07/26/2022)   Hunger Vital Sign    Worried About Running Out of Food in the Last Year: Never true    Ran Out of Food in the Last Year: Never true   Transportation Needs: No Transportation Needs (07/26/2022)   PRAPARE - Hydrologist (Medical): No    Lack of Transportation (Non-Medical): No  Physical Activity: Insufficiently Active (07/26/2022)   Exercise Vital Sign    Days of Exercise per Week: 3 days    Minutes of Exercise per Session: 40 min  Stress: No Stress Concern Present (07/26/2022)   [place]    Feeling of Stress : Only a little  Social Connections: Moderately Isolated (07/26/2022)   Social Connection and Isolation Panel [NHANES]    Frequency of Communication with Friends and Family: Twice a week    Frequency of Social Gatherings with Friends and Family: Once a week    Attends Religious Services: Never    Marine scientist or Organizations: No    Attends Music therapist: Never    Marital Status: Married    Family History        Family History  Problem Relation Age of Onset   Diabetes Mother    Cancer Mother 31       lymphoma   Diabetes Father    Pulmonary fibrosis Father    Diabetes Sister    Diabetes Brother    Dementia Maternal Grandmother    Alzheimer's disease Maternal Grandfather    Breast cancer Neg Hx  Patient Active Problem List   Diagnosis Date Noted   Insomnia 01/27/2022   Allergic rhinitis 01/27/2022   Gastroesophageal reflux disease 01/27/2022   Attention deficit disorder 01/27/2022   ASCUS with positive high risk HPV cervical 08/07/2021   Class 1 obesity with body mass index (BMI) of 34.0 to 34.9 in adult 04/09/2021   Vitamin D deficiency 04/09/2021   Anxiety disorder 01/16/2018   Current moderate episode of major depressive disorder without prior episode ([place]) 01/16/2018   Family history of diabetes mellitus 01/16/2018    Past Surgical History:  Procedure Laterality Date   APPENDECTOMY  2009   BREAST REDUCTION SURGERY Bilateral 01/16/2020   Procedure: MAMMARY REDUCTION   (BREAST);  Surgeon: Wallace Going, DO;  Location: [facility];  Service: Plastics;  Laterality: Bilateral;  3.5 hours   CESAREAN SECTION     x2   REDUCTION MAMMAPLASTY Bilateral 12/2019     Current Outpatient Medications:    acetaminophen (TYLENOL) 325 MG tablet, Take 650 mg by mouth as needed., Disp: , Rfl:    busPIRone (BUSPAR) 15 MG tablet, Take 0.5-1 tablets (7.5-15 mg total) by mouth 2 (two) times daily as needed., Disp: 180 tablet, Rfl: 3   Cholecalciferol (VITAMIN D3) 50 MCG (2000 UT) capsule, Take 1 capsule (2,000 Units total) by mouth daily., Disp: , Rfl:    escitalopram (LEXAPRO) 20 MG tablet, Take 1 tablet (20 mg total) by mouth daily., Disp: 90 tablet, Rfl: 3   fluticasone (FLONASE) 50 MCG/ACT nasal spray, SHAKE LIQUID AND USE 2 SPRAYS IN EACH NOSTRIL DAILY, Disp: 16 g, Rfl: 5   ibuprofen (ADVIL,MOTRIN) 200 MG tablet, Take 400 mg by mouth as needed., Disp: , Rfl:    levocetirizine (XYZAL) 5 MG tablet, TAKE 1 TABLET(5 MG) BY MOUTH EVERY EVENING, Disp: 90 tablet, Rfl: 3   levonorgestrel (MIRENA) 20 MCG/24HR IUD, 1 Intra Uterine Device by Intrauterine route continuous., Disp: , Rfl:    VYVANSE 20 MG capsule, Take 20 mg by mouth every morning., Disp: , Rfl:    ciprofloxacin-dexamethasone (CIPRODEX) OTIC suspension, Place 4 gtts in each ear BID for 7 days (Patient not taking: Reported on 07/26/2022), Disp: 7.5 mL, Rfl: 0   traZODone (DESYREL) 50 MG tablet, Take 1 tablet (50 mg total) by mouth at bedtime as needed for sleep. (Patient not taking: Reported on 07/26/2022), Disp: 90 tablet, Rfl: 3  No Known Allergies  Patient Care Team: Delsa Grana, PA-C as PCP - General (Family Medicine)   Chart Review: I personally reviewed active problem list, medication list, allergies, family history, social history, health maintenance, notes from last encounter, lab results, imaging with the patient/caregiver today.   Review of Systems  Constitutional: Negative.   HENT:  Negative.    Eyes: Negative.   Respiratory: Negative.    Cardiovascular: Negative.   Gastrointestinal: Negative.   Endocrine: Negative.   Genitourinary: Negative.   Musculoskeletal: Negative.   Skin: Negative.   Allergic/Immunologic: Negative.   Neurological: Negative.   Hematological: Negative.   Psychiatric/Behavioral: Negative.    All other systems reviewed and are negative.         Objective:   Vitals:  Vitals:   07/26/22 1529  BP: 130/78  Pulse: 97  Resp: 16  Temp: 98 F (36.7 C)  TempSrc: Oral  SpO2: 94%  Weight: 189 lb 6.4 oz (85.9 kg)  Height: _0  (1.6 m)    Body mass index is 33.55 kg/m.  Physical Exam Vitals and nursing note reviewed.  Constitutional:  General: She is not in acute distress.    Appearance: Normal appearance. She is well-developed. She is not ill-appearing, toxic-appearing or diaphoretic.  HENT:     Head: Normocephalic and atraumatic.     Right Ear: Tympanic membrane, ear canal and external ear normal. There is no impacted cerumen.     Left Ear: Tympanic membrane, ear canal and external ear normal. There is no impacted cerumen.     Nose: Nose normal.     Mouth/Throat:     Mouth: Mucous membranes are moist.     Pharynx: Oropharynx is clear. Uvula midline. No oropharyngeal exudate.  Eyes:     General: Lids are normal. No scleral icterus.       Right eye: No discharge.        Left eye: No discharge.     Conjunctiva/sclera: Conjunctivae normal.  Neck:     Trachea: Phonation normal. No tracheal deviation.  Cardiovascular:     Rate and Rhythm: Normal rate and regular rhythm.     Pulses: Normal pulses.          Radial pulses are 2+ on the right side and 2+ on the left side.       Posterior tibial pulses are 2+ on the right side and 2+ on the left side.     Heart sounds: Normal heart sounds. No murmur heard.    No friction rub. No gallop.  Pulmonary:     Effort: Pulmonary effort is normal. No respiratory distress.     Breath  sounds: Normal breath sounds. No stridor. No wheezing, rhonchi or rales.  Chest:     Chest wall: No tenderness.  Abdominal:     General: Bowel sounds are normal. There is no distension.     Palpations: Abdomen is soft.     Tenderness: There is no abdominal tenderness. There is no guarding or rebound.  Musculoskeletal:        General: No deformity. Normal range of motion.     Cervical back: Normal range of motion and neck supple.     Right lower leg: No edema.     Left lower leg: No edema.  Lymphadenopathy:     Cervical: No cervical adenopathy.  Skin:    General: Skin is warm and dry.     Coloration: Skin is not jaundiced or pale.     Findings: No rash.  Neurological:     Mental Status: She is alert. Mental status is at baseline.     Motor: No abnormal muscle tone.     Gait: Gait normal.  Psychiatric:        Mood and Affect: Mood normal.        Speech: Speech normal.        Behavior: Behavior normal.       Fall Risk:    07/26/2022    3:27 PM 01/27/2022    2:24 PM 07/23/2021    8:50 AM 04/08/2021    3:25 PM 02/10/2021    2:35 PM  Fall Risk   Falls in the past year? 0 0 0 0 0  Number falls in past yr: 0 0 0 0 0  Injury with Fall? 0 0 0 0 0  Risk for fall due to : No Fall Risks      Follow up Falls prevention discussed;Education provided        Functional Status Survey: Is the patient deaf or have difficulty hearing?: No Does the patient have difficulty seeing, even when wearing  glasses/contacts?: No Does the patient have difficulty concentrating, remembering, or making decisions?: No Does the patient have difficulty walking or climbing stairs?: No Does the patient have difficulty dressing or bathing?: No Does the patient have difficulty doing errands alone such as visiting a doctor's office or shopping?: No   Assessment & Plan:    CPE completed today  USPSTF grade A and B recommendations reviewed with patient; age-appropriate recommendations, preventive care,  screening tests, etc discussed and encouraged; healthy living encouraged; see AVS for patient education given to patient  Discussed importance of 150 minutes of physical activity weekly, AHA exercise recommendations given to pt in AVS/handout  Discussed importance of healthy diet:  eating lean meats and proteins, avoiding trans fats and saturated fats, avoid simple sugars and excessive carbs in diet, eat 6 servings of fruit/vegetables daily and drink plenty of water and avoid sweet beverages.    Recommended pt to do annual eye exam and routine dental exams/cleanings  Depression, alcohol, fall screening completed as documented above and per flowsheets  Advance Care planning information and packet discussed and offered today, encouraged pt to discuss with family members/spouse/partner/friends and complete Advanced directive packet and bring copy to office   Reviewed Health Maintenance: Health Maintenance  Topic Date Due   Hepatitis C Screening  Never done   COVID-19 Vaccine (3 - Pfizer series) 04/15/2020   MAMMOGRAM  02/26/2022   PAP SMEAR-Modifier  08/07/2024   TETANUS/TDAP  01/17/2029   HIV Screening  Completed   HPV VACCINES  Aged Out   INFLUENZA VACCINE  Discontinued    Immunizations: Immunization History  Administered Date(s) Administered   Influenza,inj,Quad PF,6+ Mos 10/07/2020, 07/23/2021   PFIZER(Purple Top)SARS-COV-2 Vaccination 01/21/2020, 02/19/2020   Tdap 01/18/2019   Vaccines:  HPV: up to at age 72 , ask insurance if age between 24-45  Shingrix: 3-64 yo and ask insurance if covered when patient above 90 yo Pneumonia: not currently indicated educated and discussed with patient. Flu: recommended annually educated and discussed with patient. COVID:      ICD-10-CM   1. Annual physical exam  Z00.00 CBC with Differential/Platelet    TSH    Hepatitis C antibody    2. Need for influenza vaccination  Z23     3. Encounter for screening mammogram for malignant neoplasm  of breast  Z12.31 MM 3D SCREEN BREAST BILATERAL    4. Need for hepatitis C screening test  Z11.59 Hepatitis C antibody    5. Hyperlipidemia, unspecified hyperlipidemia type  E78.5    labs recently done with employer screening    6. IUD check up  Z30.431 Ambulatory referral to Obstetrics / Gynecology   mirena placed 08/2014 - due for replacement - contact [facility] > 7 years     7. Allergic rhinitis, unspecified seasonality, unspecified trigger  J30.9 fluticasone (FLONASE) 50 MCG/ACT nasal spray   meds refilled    8. Attention deficit disorder, unspecified hyperactivity presence  F98.8    has been on vyvanse now consistantly, did specialist eval and management, we will take over medical management - database reviewed 3 months rx sent in          Delsa Grana, [place] 07/26/22 3:34 PM  [place]

## 2022-07-27 DIAGNOSIS — R7303 Prediabetes: Secondary | ICD-10-CM | POA: Insufficient documentation

## 2022-07-27 NOTE — Progress Notes (Signed)
Addendum -  Labs received and reviewed Dx and labs updated:    ICD-10-CM   1. Annual physical exam  Z00.00 CBC with Differential/Platelet    Hepatitis C antibody    TSH    Comprehensive metabolic panel    CANCELED: CBC with Differential/Platelet    CANCELED: TSH    CANCELED: Hepatitis C antibody    2. Need for influenza vaccination  Z23     3. Encounter for screening mammogram for malignant neoplasm of breast  Z12.31 MM 3D SCREEN BREAST BILATERAL    4. Need for hepatitis C screening test  Z11.59 Hepatitis C antibody    CANCELED: Hepatitis C antibody    5. Hyperlipidemia, unspecified hyperlipidemia type  E78.5 Comprehensive metabolic panel   labs recently done with employer screening - labs received and abstracted - high LDL and total cholesterol, 1.1% ASCVD risk     6. IUD check up  Z30.431 Ambulatory referral to Obstetrics / Gynecology   mirena placed 08/2014 - due for replacement - contact [facility] > 7 years     7. Allergic rhinitis, unspecified seasonality, unspecified trigger  J30.9 fluticasone (FLONASE) 50 MCG/ACT nasal spray   meds refilled    8. Attention deficit disorder, unspecified hyperactivity presence  F98.8 lisdexamfetamine (VYVANSE) 30 MG capsule    lisdexamfetamine (VYVANSE) 30 MG capsule    lisdexamfetamine (VYVANSE) 30 MG capsule   has been on vyvanse now consistantly, did specialist eval and management, we will take over medical management - database reviewed 3 months rx sent in    9. Prediabetes  R73.03 Comprehensive metabolic panel   H6W 5.9 with employee labs - abstracted to chart    10. Class 1 obesity with serious comorbidity and body mass index (BMI) of 33.0 to 33.9 in adult, unspecified obesity type  E66.9 CBC with Differential/Platelet   Z68.33 TSH    Comprehensive metabolic panel   BMI decreased, high cholesterol and new prediabetes     Pt will complete labs at [employer] with employee lab benefits   Offered to recheck A1C/lipids in a few  months with new prediabetes and high cholesterol Lab Results  Component Value Date   HGBA1C 5.9 06/15/2022   Lab Results  Component Value Date   CHOL 222 (A) 06/15/2022   HDL 45 06/15/2022   LDLCALC 143 06/15/2022   TRIG 190 (A) 06/15/2022   CHOLHDL 4.4 02/10/2021   [place] msg sent to pt about all this. Delsa Grana, PA-C

## 2022-07-27 NOTE — Addendum Note (Signed)
Addended by: Delsa Grana on: 07/27/2022 03:53 PM   Modules accepted: Orders

## 2022-07-28 NOTE — Telephone Encounter (Signed)
Labs have been mailed to patient  

## 2022-08-06 DIAGNOSIS — Z1159 Encounter for screening for other viral diseases: Secondary | ICD-10-CM | POA: Diagnosis not present

## 2022-08-06 DIAGNOSIS — Z0000 Encounter for general adult medical examination without abnormal findings: Secondary | ICD-10-CM | POA: Diagnosis not present

## 2022-08-06 DIAGNOSIS — Z6833 Body mass index (BMI) 33.0-33.9, adult: Secondary | ICD-10-CM | POA: Diagnosis not present

## 2022-08-06 DIAGNOSIS — E669 Obesity, unspecified: Secondary | ICD-10-CM | POA: Diagnosis not present

## 2022-08-07 LAB — COMPREHENSIVE METABOLIC PANEL
ALT: 25 IU/L (ref 0–32)
AST: 20 IU/L (ref 0–40)
Albumin/Globulin Ratio: 1.9 (ref 1.2–2.2)
Albumin: 4.8 g/dL (ref 3.9–4.9)
Alkaline Phosphatase: 101 IU/L (ref 44–121)
BUN/Creatinine Ratio: 10 (ref 9–23)
BUN: 7 mg/dL (ref 6–24)
Bilirubin Total: 0.5 mg/dL (ref 0.0–1.2)
CO2: 21 mmol/L (ref 20–29)
Calcium: 9.5 mg/dL (ref 8.7–10.2)
Chloride: 102 mmol/L (ref 96–106)
Creatinine, Ser: 0.71 mg/dL (ref 0.57–1.00)
Globulin, Total: 2.5 g/dL (ref 1.5–4.5)
Glucose: 81 mg/dL (ref 70–99)
Potassium: 4.4 mmol/L (ref 3.5–5.2)
Sodium: 140 mmol/L (ref 134–144)
Total Protein: 7.3 g/dL (ref 6.0–8.5)
eGFR: 109 mL/min/{1.73_m2} (ref >59)

## 2022-08-07 LAB — CBC WITH DIFFERENTIAL/PLATELET
Basophils Absolute: 0.1 10*3/uL (ref 0.0–0.2)
Basos: 1 %
EOS (ABSOLUTE): 0.2 10*3/uL (ref 0.0–0.4)
Eos: 2 %
Hematocrit: 40.6 % (ref 34.0–46.6)
Hemoglobin: 13.2 g/dL (ref 11.1–15.9)
Immature Grans (Abs): 0.1 10*3/uL (ref 0.0–0.1)
Immature Granulocytes: 1 %
Lymphocytes Absolute: 2.7 10*3/uL (ref 0.7–3.1)
Lymphs: 22 %
MCH: 28.7 pg (ref 26.6–33.0)
MCHC: 32.5 g/dL (ref 31.5–35.7)
MCV: 88 fL (ref 79–97)
Monocytes Absolute: 0.6 10*3/uL (ref 0.1–0.9)
Monocytes: 5 %
Neutrophils Absolute: 8.8 10*3/uL — ABNORMAL HIGH (ref 1.4–7.0)
Neutrophils: 69 %
Platelets: 453 10*3/uL — ABNORMAL HIGH (ref 150–450)
RBC: 4.6 x10E6/uL (ref 3.77–5.28)
RDW: 12.8 % (ref 11.7–15.4)
WBC: 12.6 10*3/uL — ABNORMAL HIGH (ref 3.4–10.8)

## 2022-08-07 LAB — TSH: TSH: 2.36 u[IU]/mL (ref 0.450–4.500)

## 2022-08-07 LAB — HEPATITIS C ANTIBODY: Hep C Virus Ab: NONREACTIVE

## 2022-08-17 ENCOUNTER — Ambulatory Visit
Admission: RE | Admit: 2022-08-17 | Discharge: 2022-08-17 | Disposition: A | Discharge status: Home / Self Care | Payer: BC Managed Care – PPO | Source: Ambulatory Visit | Attending: Family Medicine | Admitting: Family Medicine

## 2022-08-17 VITALS — BP 127/85 | HR 117 | Temp 99.5°F | Resp 18 | Ht 63.0 in | Wt 189.0 lb

## 2022-08-17 DIAGNOSIS — U071 COVID-19: Secondary | ICD-10-CM

## 2022-08-17 LAB — POC SARS CORONAVIRUS 2 AG -  ED

## 2022-08-17 MED ORDER — PAXLOVID (300/100) 20 X 150 MG & 10 X 100MG PO TBPK: ORAL_TABLET | ORAL | 0 refills | Status: DC

## 2022-08-17 NOTE — ED Provider Notes (Signed)
[facility]    CSN: [id number] Arrival date & time: 08/17/22  1644      History   Chief Complaint Chief Complaint  Patient presents with   Cough    HPI Laura Watson is a 41 y.o. female.   HPI  Patient's husband's been sick with a fever for 2 to 3 days.  He came to see me yesterday was diagnosed with COVID.  Patient states her sore throat started last night.  Today she has headaches and body aches, exhausted.  Cough and congestion with joint aches and fever.  Past Medical History:  Diagnosis Date   Anxiety    PONV (postoperative nausea and vomiting)     Patient Active Problem List   Diagnosis Date Noted   Prediabetes 07/27/2022   Insomnia 01/27/2022   Allergic rhinitis 01/27/2022   Gastroesophageal reflux disease 01/27/2022   Attention deficit disorder 01/27/2022   ASCUS with positive high risk HPV cervical 08/07/2021   Class 1 obesity with serious comorbidity and body mass index (BMI) of 33.0 to 33.9 in adult 04/09/2021   Vitamin D deficiency 04/09/2021   Anxiety disorder 01/16/2018   Current moderate episode of major depressive disorder without prior episode ([place]) 01/16/2018   Family history of diabetes mellitus 01/16/2018    Past Surgical History:  Procedure Laterality Date   APPENDECTOMY  2009   BREAST REDUCTION SURGERY Bilateral 01/16/2020   Procedure: MAMMARY REDUCTION  (BREAST);  Surgeon: Wallace Going, DO;  Location: [place];  Service: Plastics;  Laterality: Bilateral;  3.5 hours   CESAREAN SECTION     x2   REDUCTION MAMMAPLASTY Bilateral 12/2019   WISDOM TOOTH EXTRACTION  07/01/2022    OB History     Gravida  2   Para  2   Term  2   Preterm      AB      Living  2      SAB      IAB      Ectopic      Multiple      Live Births  2            Home Medications    Prior to Admission medications   Medication Sig Start Date End Date Taking? Authorizing Provider  nirmatrelvir & ritonavir  (PAXLOVID, 300/100,) 20 x 150 MG & 10 x '100MG'$  TBPK Take as directed 08/17/22  Yes Raylene Everts, MD  acetaminophen (TYLENOL) 325 MG tablet Take 650 mg by mouth as needed.    [provider]  busPIRone (BUSPAR) 15 MG tablet Take 0.5-1 tablets (7.5-15 mg total) by mouth 2 (two) times daily as needed. 01/27/22   Delsa Grana, PA-C  Cholecalciferol (VITAMIN D3) 50 MCG (2000 UT) capsule Take 1 capsule (2,000 Units total) by mouth daily. 07/23/21   Delsa Grana, PA-C  escitalopram (LEXAPRO) 20 MG tablet Take 1 tablet (20 mg total) by mouth daily. 01/27/22   Delsa Grana, PA-C  fluticasone (FLONASE) 50 MCG/ACT nasal spray SHAKE LIQUID AND USE 2 SPRAYS IN [facility] NOSTRIL DAILY 07/26/22   Delsa Grana, PA-C  ibuprofen (ADVIL,MOTRIN) 200 MG tablet Take 400 mg by mouth as needed.    [provider]  levocetirizine (XYZAL) 5 MG tablet TAKE 1 TABLET(5 MG) BY MOUTH EVERY EVENING 01/27/22   Delsa Grana, PA-C  levonorgestrel (MIRENA) 20 MCG/24HR IUD 1 Intra Uterine Device by Intrauterine route continuous.    [provider]  lisdexamfetamine (VYVANSE) 30 MG capsule Take 1  capsule (30 mg total) by mouth daily. 08/16/22   Delsa Grana, PA-C  lisdexamfetamine (VYVANSE) 30 MG capsule Take 1 capsule (30 mg total) by mouth daily. 09/15/22   Delsa Grana, PA-C  lisdexamfetamine (VYVANSE) 30 MG capsule Take 1 capsule (30 mg total) by mouth daily. 10/15/22   Delsa Grana, PA-C    Family History Family History  Problem Relation Age of Onset   Diabetes Mother    Cancer Mother 55       lymphoma   Diabetes Father    Pulmonary fibrosis Father    Diabetes Sister    Diabetes Brother    Dementia Maternal Grandmother    Alzheimer's disease Maternal Grandfather    Breast cancer Neg Hx     Social History Social History   Tobacco Use   Smoking status: Never   Smokeless tobacco: Never  Vaping Use   Vaping Use: Never used  Substance Use Topics   Alcohol use: Not Currently   Drug use: No      Allergies   Patient has no known allergies.   Review of Systems Review of Systems See HPI  Physical Exam Triage Vital Signs ED Triage Vitals  Enc Vitals Group     BP 08/17/22 1657 127/85     Pulse Rate 08/17/22 1657 (!) 117     Resp 08/17/22 1657 18     Temp 08/17/22 1657 99.5 F (37.5 C)     Temp Source 08/17/22 1657 Oral     SpO2 08/17/22 1657 99 %     Weight 08/17/22 1659 189 lb (85.7 kg)     Height 08/17/22 1659 '5\' 3"'$  (1.6 m)     Head Circumference --      Peak Flow --      Pain Score 08/17/22 1659 4     Pain Loc --      Pain Edu? --      Excl. in [place]? --    No data found.  Updated Vital Signs BP 127/85 (BP Location: Right Arm)   Pulse (!) 117   Temp 99.5 F (37.5 C) (Oral)   Resp 18   Ht '5\' 3"'$  (1.6 m)   Wt 85.7 kg   SpO2 99%   BMI 33.48 kg/m       Physical Exam Constitutional:      General: She is not in acute distress.    Appearance: She is well-developed. She is ill-appearing.  HENT:     Head: Normocephalic and atraumatic.  Eyes:     Conjunctiva/sclera: Conjunctivae normal.     Pupils: Pupils are equal, round, and reactive to light.  Cardiovascular:     Rate and Rhythm: Normal rate.  Pulmonary:     Effort: Pulmonary effort is normal. No respiratory distress.  Abdominal:     General: There is no distension.     Palpations: Abdomen is soft.  Musculoskeletal:        General: Normal range of motion.     Cervical back: Normal range of motion.  Skin:    General: Skin is warm and dry.  Neurological:     Mental Status: She is alert.      UC Treatments / Results  Labs (all labs ordered are listed, but only abnormal results are displayed) Labs Reviewed  POC SARS CORONAVIRUS 2 AG -  ED - Abnormal    EKG   Radiology No results found.  Procedures Procedures (including critical care time)  Medications Ordered in UC Medications - No  data to display  Initial Impression / Assessment and Plan / UC Course  I have reviewed the triage  vital signs and the nursing notes.  Pertinent labs & imaging results that were available during my care of the patient were reviewed by me and considered in my medical decision making (see chart for details).    Reviewed COVID.  Symptomatic care.  Quarantine and isolation.  Masks. Final Clinical Impressions(s) / UC Diagnoses   Final diagnoses:  [id number]     Discharge Instructions      Take Paxlovid 2 times a day for 5 days May take over-the-counter cough and cold medicine as needed Make sure you are drinking lots of fluids Quarantine for 5 days, wear a mask for additional 5 days Call for problems   ED Prescriptions     Medication Sig Dispense Auth. Provider   nirmatrelvir & ritonavir (PAXLOVID, 300/100,) 20 x 150 MG & 10 x '100MG'$  TBPK Take as directed 1 each Meda Coffee Jennette Banker, MD      PDMP not reviewed this encounter.   Raylene Everts, MD 08/17/22 1755

## 2022-08-17 NOTE — ED Triage Notes (Signed)
Husband tested positive here yesterday  Pt's  sore throat started last night Dayquil at noon Per pt, " Flu/COVID symptoms, cough, congestion, joint aches, and possible fever" Unable to check temp - no thermometer at home

## 2022-08-17 NOTE — Discharge Instructions (Signed)
Take Paxlovid 2 times a day for 5 days May take over-the-counter cough and cold medicine as needed Make sure you are drinking lots of fluids Quarantine for 5 days, wear a mask for additional 5 days Call for problems

## 2022-08-18 ENCOUNTER — Telehealth: Payer: Self-pay | Admitting: Emergency Medicine

## 2022-08-18 NOTE — Telephone Encounter (Signed)
Left message, hope she is feeling better

## 2022-08-30 ENCOUNTER — Encounter: Payer: Self-pay | Admitting: Family Medicine

## 2022-08-30 ENCOUNTER — Telehealth (INDEPENDENT_AMBULATORY_CARE_PROVIDER_SITE_OTHER): Payer: BC Managed Care – PPO | Admitting: Family Medicine

## 2022-08-30 VITALS — Wt 189.0 lb

## 2022-08-30 DIAGNOSIS — Z124 Encounter for screening for malignant neoplasm of cervix: Secondary | ICD-10-CM | POA: Insufficient documentation

## 2022-08-30 DIAGNOSIS — J029 Acute pharyngitis, unspecified: Secondary | ICD-10-CM | POA: Insufficient documentation

## 2022-08-30 DIAGNOSIS — J069 Acute upper respiratory infection, unspecified: Secondary | ICD-10-CM

## 2022-08-30 DIAGNOSIS — M25539 Pain in unspecified wrist: Secondary | ICD-10-CM | POA: Insufficient documentation

## 2022-08-30 DIAGNOSIS — L309 Dermatitis, unspecified: Secondary | ICD-10-CM | POA: Insufficient documentation

## 2022-08-30 DIAGNOSIS — R300 Dysuria: Secondary | ICD-10-CM | POA: Insufficient documentation

## 2022-08-30 DIAGNOSIS — R102 Pelvic and perineal pain: Secondary | ICD-10-CM | POA: Insufficient documentation

## 2022-08-30 MED ORDER — PREDNISONE 10 MG (21) PO TBPK
ORAL_TABLET | ORAL | 0 refills | Status: DC
Start: 2022-08-30 — End: 2022-12-06

## 2022-08-30 NOTE — Progress Notes (Signed)
Virtual Visit via Video Note  I connected with Laura Watson on 08/30/22 at  9:40 AM EST by a video enabled telemedicine application and verified that I am speaking with the correct person using two identifiers.  Location: Patient: home Provider: [facility]   I discussed the limitations of evaluation and management by telemedicine and the availability of in person appointments. The patient expressed understanding and agreed to proceed.  History of Present Illness:  UPPER RESPIRATORY TRACT INFECTION - had COVID few weeks ago, took Paxlovid.  - symptom onset Thursday, 4 days ago  Fever: no Cough: no Shortness of breath: no Chest pain: no Nasal congestion: yes Runny nose: yes Post nasal drip: yes Sinus pressure: yes Headache: yes Relief with OTC cold/cough medications: no  Treatments attempted: dayquil, mucinex     Observations/Objective:  Well appearing, in NAD. Speaks in full sentences. Comfortable WOB on RA. No resp distress.    Assessment and Plan:  VIRAL URI Subsequent viral URI after COVID vs rebound COVID after paxlovid. Did not retest after finishing paxlovid so unclear if true rebound COVID. Given not on immunosuppressants and not at risk for severe complications, no need to retreat with paxlovid regardless, will defer retesting. Currently with mild symptoms. Will provide steroid taper given lack of relief with OTC decongestant. Reviewed isolation guidelines, and emergency precautions.      I discussed the assessment and treatment plan with the patient. The patient was provided an opportunity to ask questions and all were answered. The patient agreed with the plan and demonstrated an understanding of the instructions.   The patient was advised to call back or seek an in-person evaluation if the symptoms worsen or if the condition fails to improve as anticipated.  I provided 7 minutes of non-face-to-face time during this encounter.   Myles Gip, DO

## 2022-09-08 ENCOUNTER — Encounter: Payer: Self-pay | Admitting: Licensed Practical Nurse

## 2022-09-08 ENCOUNTER — Ambulatory Visit (INDEPENDENT_AMBULATORY_CARE_PROVIDER_SITE_OTHER): Payer: BC Managed Care – PPO | Admitting: Licensed Practical Nurse

## 2022-09-08 VITALS — BP 141/88 | HR 96 | Wt 191.0 lb

## 2022-09-08 DIAGNOSIS — Z30432 Encounter for removal of intrauterine contraceptive device: Secondary | ICD-10-CM

## 2022-09-08 DIAGNOSIS — Z30433 Encounter for removal and reinsertion of intrauterine contraceptive device: Secondary | ICD-10-CM

## 2022-09-08 DIAGNOSIS — Z3040 Encounter for surveillance of contraceptives, unspecified: Secondary | ICD-10-CM

## 2022-09-08 DIAGNOSIS — Z30430 Encounter for insertion of intrauterine contraceptive device: Secondary | ICD-10-CM

## 2022-09-08 MED ORDER — LEVONORGESTREL 20 MCG/DAY IU IUD
1.0000 | INTRAUTERINE_SYSTEM | Freq: Once | INTRAUTERINE | Status: AC
  Administered 2022-09-08: 1 via INTRAUTERINE

## 2022-09-08 NOTE — Progress Notes (Signed)
    GYNECOLOGY OFFICE PROCEDURE NOTE  Laura Watson is a 41 y.o. [id number] here for IUD removal and reinsertion. The patient currently has a Mirena IUD placed in 2015, which will be replaced with a Mirena IUD today.  No GYN concerns.  Last pap smear was on 07/2021  and was normal (had colpo in 2021)  IUD Removal and Reinsertion  Patient identified, informed consent performed, consent signed.   Discussed risks of irregular bleeding, cramping, infection, malpositioning or uterine perforation of the IUD which may require further procedures. Time out was performed. Speculum placed in the vagina. The strings of the IUD were grasped and pulled using ring forceps. The IUD was successfully removed in its entirety. The cervix was cleaned with Betadine x 2 and grasped anteriorly with a single tooth tenaculum.  The uterus was sounded to 6cm using a uterine sound.  The IUD was then placed per manufacturer's recommendations. Strings trimmed to 3 cm. Tenaculum was removed, good hemostasis noted. Patient tolerated procedure well.   Patient was given post-procedure instructions.  Patient was also asked to check IUD strings periodically and follow up in 6 weeks for IUD check.  BP (!) 141/88   Pulse 96   Wt 191 lb (86.6 kg)   BMI 33.83 kg/m   BP elevated today, pt admits that she is a little anxious today. Reports her BP tends to be a little high when she first arrives to appointments and always is normal after. Will be seeing her PCP in a few months, rec BP check with PCP.   Roberto Scales, [facility]

## 2022-09-09 ENCOUNTER — Encounter: Payer: Self-pay | Admitting: Licensed Practical Nurse

## 2022-09-10 ENCOUNTER — Ambulatory Visit
Admission: RE | Admit: 2022-09-10 | Discharge: 2022-09-10 | Disposition: A | Discharge status: Home / Self Care | Payer: BC Managed Care – PPO | Source: Ambulatory Visit | Attending: Family Medicine | Admitting: Family Medicine

## 2022-09-10 DIAGNOSIS — Z1231 Encounter for screening mammogram for malignant neoplasm of breast: Secondary | ICD-10-CM | POA: Insufficient documentation

## 2022-10-14 ENCOUNTER — Ambulatory Visit (INDEPENDENT_AMBULATORY_CARE_PROVIDER_SITE_OTHER): Payer: BC Managed Care – PPO | Admitting: Licensed Practical Nurse

## 2022-10-14 ENCOUNTER — Encounter: Payer: Self-pay | Admitting: Licensed Practical Nurse

## 2022-10-14 ENCOUNTER — Other Ambulatory Visit (HOSPITAL_COMMUNITY)
Admission: RE | Admit: 2022-10-14 | Discharge: 2022-10-14 | Disposition: A | Discharge status: Home / Self Care | Payer: BC Managed Care – PPO | Source: Ambulatory Visit | Attending: Licensed Practical Nurse | Admitting: Licensed Practical Nurse

## 2022-10-14 VITALS — BP 130/85 | HR 84 | Ht 63.5 in | Wt 192.7 lb

## 2022-10-14 DIAGNOSIS — Z30431 Encounter for routine checking of intrauterine contraceptive device: Secondary | ICD-10-CM | POA: Diagnosis not present

## 2022-10-14 DIAGNOSIS — Z124 Encounter for screening for malignant neoplasm of cervix: Secondary | ICD-10-CM

## 2022-10-14 DIAGNOSIS — Z01411 Encounter for gynecological examination (general) (routine) with abnormal findings: Secondary | ICD-10-CM | POA: Diagnosis not present

## 2022-10-14 DIAGNOSIS — N898 Other specified noninflammatory disorders of vagina: Secondary | ICD-10-CM | POA: Diagnosis not present

## 2022-10-14 DIAGNOSIS — Z01419 Encounter for gynecological examination (general) (routine) without abnormal findings: Secondary | ICD-10-CM

## 2022-10-14 NOTE — Progress Notes (Signed)
Gynecology Annual Exam  PCP: Delsa Grana, PA-C  Chief Complaint:  Chief Complaint  Patient presents with   Gynecologic Exam    History of Present Illness: Patient is a 41 y.o. [id number] presents for annual exam. The patient has no complaints today.   LMP: No LMP recorded. (Menstrual status: IUD). Gets PMS like symptoms when she would have a cycle  Has felt her IUD strings   The patient is sexually active. She currently uses IUD for contraception. She denies dyspareunia.  The patient does perform self breast exams.  There is no notable family history of breast or ovarian cancer in her family.  The patient wears seatbelts: yes.   The patient has regular exercise:  had COVID in October which disrupted her routine, likes to go to the gym 3 times a week  .    The patient denies current symptoms of depression.  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   Past Medical History:  Patient Active Problem List   Diagnosis Date Noted   Pelvic pain 08/30/2022   Dermatitis 08/30/2022   Dysuria 08/30/2022   Pain in joint, forearm 08/30/2022   Screening for malignant neoplasm of cervix 08/30/2022   Sore throat 08/30/2022    Rapid strep negative. Pt informed she will be notified in case of pos throat culture. Given home care instructions per [facility]: Sore throat. Instructed to call clinic for fever, increasing pain, increased difficulty swallowing or concerns. V/U    Prediabetes  07/27/2022   Insomnia 01/27/2022   Allergic rhinitis 01/27/2022   Gastroesophageal reflux disease 01/27/2022   Attention deficit disorder 01/27/2022   ASCUS with positive high risk HPV cervical 08/07/2021   Class 1 obesity with serious comorbidity and body mass index (BMI) of 33.0 to 33.9 in adult 04/09/2021   Vitamin D deficiency 04/09/2021   Anxiety disorder 01/16/2018   Current moderate episode of major depressive disorder without prior episode ([place]) 01/16/2018   Family history of diabetes mellitus 01/16/2018    Past Surgical History:  Past Surgical History:  Procedure Laterality Date   APPENDECTOMY  2009   BREAST REDUCTION SURGERY Bilateral 01/16/2020   Procedure: MAMMARY REDUCTION  (BREAST);  Surgeon: Wallace Going, DO;  Location: [place];  Service: Plastics;  Laterality: Bilateral;  3.5 hours   CESAREAN SECTION     x2   REDUCTION MAMMAPLASTY Bilateral 12/2019   WISDOM TOOTH EXTRACTION  07/01/2022    Gynecologic History:  No LMP recorded. (Menstrual status: IUD). Contraception: IUD Last Pap: Results were: 2022 no abnormalities 2020 ASC-US HPV +  Last mammogram: 08/2022 Results were: BI-RAD I  Obstetric History: [id number]  Family History:  Family History  Problem Relation Age of Onset   Diabetes Mother    Cancer Mother 64       lymphoma   Diabetes Father    Pulmonary fibrosis Father    Diabetes Sister    Diabetes Brother    Dementia Maternal  Grandmother    Alzheimer's disease Maternal Grandfather    Breast cancer Neg Hx     Social History:  Social History   Socioeconomic History   Marital status: Married    Spouse name: Not on file   Number of children: 2   Years of education: Not on file   Highest education level: Some college, no degree  Occupational History   Not on file  Tobacco Use   Smoking status: Never   Smokeless tobacco: Never  Vaping Use   Vaping Use: Never used  Substance and Sexual Activity   Alcohol use: Not  Currently   Drug use: No   Sexual activity: Yes    Partners: Male    Birth control/protection: I.U.D.    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   Getting married Nov   Social Determinants of Health   Financial Resource Strain: Low Risk  (07/26/2022)   Overall Financial Resource Strain (CARDIA)    Difficulty of Paying Living Expenses: Not hard at all  Food Insecurity: No Food Insecurity (07/26/2022)   Hunger Vital Sign    Worried About Running Out of Food in the Last Year: Never true    Ran Out of Food in the Last Year: Never true  Transportation Needs: No Transportation Needs (07/26/2022)   PRAPARE - Hydrologist (Medical): No    Lack of Transportation (Non-Medical): No  Physical Activity: Insufficiently Active (07/26/2022)   Exercise Vital Sign    Days of Exercise per Week: 3 days    Minutes of Exercise per Session: 40 min  Stress: No Stress Concern Present (07/26/2022)   [place]    Feeling of Stress : Only a little  Social Connections: Moderately Isolated (07/26/2022)   Social Connection and Isolation Panel [NHANES]    Frequency of Communication with Friends and Family: Twice a week    Frequency of Social Gatherings with Friends and Family: Once a week    Attends Religious Services: Never    Marine scientist or Organizations: No    Attends Archivist Meetings: Never    Marital Status: Married  Human resources officer Violence: Not At Risk (07/26/2022)   Humiliation, Afraid, Rape, and Kick questionnaire    Fear of Current or Ex-Partner: No    Emotionally Abused: No    Physically Abused: No    Sexually Abused: No    Allergies:  No Known Allergies  Medications: Prior to Admission  medications   Medication Sig Start Date End Date Taking? Authorizing Provider  acetaminophen (TYLENOL) 325 MG tablet Take 650 mg by mouth as needed.   Yes [provider]  busPIRone (BUSPAR) 15 MG tablet Take 0.5-1 tablets (7.5-15 mg total) by mouth 2 (two) times daily as needed. 01/27/22  Yes Delsa Grana, PA-C  Cholecalciferol (VITAMIN D3) 50 MCG (2000 UT) capsule Take 1 capsule (2,000 Units total) by mouth daily. 07/23/21  Yes Delsa Grana, PA-C  escitalopram (LEXAPRO) 20 MG tablet Take 1 tablet (20 mg total) by mouth daily. 01/27/22  Yes Delsa Grana, PA-C  fluticasone (FLONASE) 50 MCG/ACT nasal spray SHAKE LIQUID AND USE 2 SPRAYS IN EACH NOSTRIL DAILY 07/26/22  Yes Delsa Grana, PA-C  ibuprofen (ADVIL,MOTRIN) 200 MG tablet Take 400 mg  by mouth as needed.   Yes [provider]  levocetirizine (XYZAL) 5 MG tablet TAKE 1 TABLET(5 MG) BY MOUTH EVERY EVENING 01/27/22  Yes Delsa Grana, PA-C  levonorgestrel (MIRENA) 20 MCG/24HR IUD 1 Intra Uterine Device by Intrauterine route continuous.   Yes [provider]  lisdexamfetamine (VYVANSE) 30 MG capsule Take 1 capsule (30 mg total) by mouth daily. 08/16/22  Yes Delsa Grana, PA-C  nirmatrelvir & ritonavir (PAXLOVID, 300/100,) 20 x 150 MG & 10 x '100MG'$  TBPK Take as directed Patient not taking: Reported on 08/30/2022 08/17/22   Raylene Everts, MD  predniSONE (STERAPRED UNI-PAK 21 TAB) 10 MG (21) TBPK tablet Take per package directions. Patient not taking: Reported on 09/08/2022 08/30/22   Myles Gip, DO    Physical Exam Vitals: Blood pressure 130/85, pulse 84, height 5' 3.5" (1.613 m), weight 192 lb 11.2 oz (87.4 kg).  General: NAD HEENT: normocephalic, anicteric Thyroid: no enlargement, no palpable nodules Pulmonary: No increased work of breathing, CTAB Cardiovascular: RRR, distal pulses 2+ Breast: Breast symmetrical, no tenderness, no palpable nodules or masses, no skin or nipple retraction present, no nipple  discharge.  No axillary or supraclavicular lymphadenopathy. Abdomen: NABS, soft, non-tender, non-distended.  Umbilicus without lesions.  No hepatomegaly, splenomegaly or masses palpable. No evidence of hernia  Genitourinary:  External: Normal external female genitalia.  Normal urethral meatus, normal Bartholin's and Skene's glands.    Vagina: Normal vaginal mucosa, no evidence of prolapse.  Good tone  Cervix: Grossly normal in appearance, no bleeding, IUD strings not visualized   Uterus: Non-enlarged, mobile, normal contour.  No CMT  Adnexa: ovaries non-enlarged, no adnexal masses  Rectal: deferred  Lymphatic: no evidence of inguinal lymphadenopathy Extremities: no edema, erythema, or tenderness Neurologic: Grossly intact Psychiatric: mood appropriate, affect full   Assessment: 40 y.o. G2P2002 routine annual exam  Plan: Problem List Items Addressed This Visit   None Visit Diagnoses     Well woman exam    -  Primary   IUD check up       Relevant Orders   US PELVIS TRANSVAGINAL NON-OB (TV ONLY)   Vaginal discharge       Relevant Orders   Cervicovaginal ancillary only   Cervical cancer screening       Relevant Orders   Cytology - PAP       1) Mammogram - recommend yearly screening mammogram.  Mammogram Is up to date   2) STI screening  wasoffered and declined  3) ASCCP guidelines and rational discussed.  Patient opts for  based on results   screening interval  4) Contraception - the patient is currently using  IUD.  She is happy with her current form of contraception and plans to continue IUD strings not visible, [place] ordered   5) Colonoscopy -- Screening recommended starting at age 5 for average risk individuals, age 21 for individuals deemed at increased risk (including African Americans) and recommended to continue until age 3.  For patient age 70-85 individualized approach is recommended.  Gold standard screening is via colonoscopy, Cologuard screening is an acceptable  alternative for patient unwilling or unable to undergo colonoscopy.  "Colorectal cancer screening for average?risk adults: 2018 guideline update from the American Cancer Society"CA: A Cancer Journal for Clinicians: Mar 23, 2017   6) Routine healthcare maintenance including cholesterol, diabetes screening discussed managed by PCP  7) No follow-ups on file.  Roberto Scales, CNM  [facility], [facility] 10/14/2022, 5:49 PM

## 2022-10-15 LAB — CERVICOVAGINAL ANCILLARY ONLY
Bacterial Vaginitis (gardnerella): POSITIVE — AB
Candida Glabrata: NEGATIVE
Candida Vaginitis: NEGATIVE
Comment: NEGATIVE
Comment: NEGATIVE
Comment: NEGATIVE

## 2022-10-16 ENCOUNTER — Encounter: Payer: Self-pay | Admitting: Licensed Practical Nurse

## 2022-10-16 ENCOUNTER — Other Ambulatory Visit: Payer: Self-pay | Admitting: Licensed Practical Nurse

## 2022-10-16 DIAGNOSIS — B9689 Other specified bacterial agents as the cause of diseases classified elsewhere: Secondary | ICD-10-CM

## 2022-10-16 MED ORDER — METRONIDAZOLE 500 MG PO TABS: 500.0000 mg | ORAL_TABLET | Freq: Two times a day (BID) | ORAL | 0 refills | Status: DC

## 2022-10-16 NOTE — Progress Notes (Signed)
Pt seen for annual exam, reported increased discharge, swab shows BV, script for flagyl sent. Mychart message sent.  Roberto Scales, [facility]  10/16/22  4:02 PM

## 2022-10-21 ENCOUNTER — Ambulatory Visit
Admission: RE | Admit: 2022-10-21 | Discharge: 2022-10-21 | Disposition: A | Discharge status: Home / Self Care | Payer: BC Managed Care – PPO | Source: Ambulatory Visit | Attending: Licensed Practical Nurse | Admitting: Licensed Practical Nurse

## 2022-10-21 DIAGNOSIS — Z30431 Encounter for routine checking of intrauterine contraceptive device: Secondary | ICD-10-CM | POA: Diagnosis not present

## 2022-10-21 LAB — CYTOLOGY - PAP
Comment: NEGATIVE
Comment: NEGATIVE
Comment: NEGATIVE
Diagnosis: UNDETERMINED — AB
HPV 16: NEGATIVE
HPV 18 / 45: NEGATIVE
High risk HPV: POSITIVE — AB

## 2022-10-23 ENCOUNTER — Encounter: Payer: Self-pay | Admitting: Licensed Practical Nurse

## 2022-11-10 ENCOUNTER — Ambulatory Visit
Admission: EM | Admit: 2022-11-10 | Discharge: 2022-11-10 | Disposition: A | Discharge status: Home / Self Care | Payer: BC Managed Care – PPO | Attending: Emergency Medicine | Admitting: Emergency Medicine

## 2022-11-10 DIAGNOSIS — H6692 Otitis media, unspecified, left ear: Secondary | ICD-10-CM

## 2022-11-10 MED ORDER — AMOXICILLIN 875 MG PO TABS: 875.0000 mg | ORAL_TABLET | Freq: Two times a day (BID) | ORAL | 0 refills | Status: AC

## 2022-11-10 NOTE — ED Provider Notes (Signed)
Laura Watson    CSN: [id number] Arrival date & time: 11/10/22  1739      History   Chief Complaint Chief Complaint  Patient presents with   Ear Fullness    Entered by patient    HPI Laura Watson is a 42 y.o. female.  Patient presents with 2 week history of bilateral ear pain, left worse than right.  The left side of her throat has started hurting today.  Treatment at home with ibuprofen, Flonase, allergy medication; none taken today.  No fever, cough, shortness of breath, vomiting, diarrhea, or other symptoms.    The history is provided by the patient and medical records.    Past Medical History:  Diagnosis Date   Anxiety    PONV (postoperative nausea and vomiting)     Patient Active Problem List   Diagnosis Date Noted   Pelvic pain 08/30/2022   Dermatitis 08/30/2022   Dysuria 08/30/2022   Pain in joint, forearm 08/30/2022   Screening for malignant neoplasm of cervix 08/30/2022   Sore throat 08/30/2022   Prediabetes 07/27/2022   Insomnia 01/27/2022   Allergic rhinitis 01/27/2022   Gastroesophageal reflux disease 01/27/2022   Attention deficit disorder 01/27/2022   ASCUS with positive high risk HPV cervical 08/07/2021   Class 1 obesity with serious comorbidity and body mass index (BMI) of 33.0 to 33.9 in adult 04/09/2021   Vitamin D deficiency 04/09/2021   Anxiety disorder 01/16/2018   Current moderate episode of major depressive disorder without prior episode ([place]) 01/16/2018   Family history of diabetes mellitus 01/16/2018    Past Surgical History:  Procedure Laterality Date   APPENDECTOMY  2009   BREAST REDUCTION SURGERY Bilateral 01/16/2020   Procedure: MAMMARY REDUCTION  (BREAST);  Surgeon: Wallace Going, DO;  Location: [facility];  Service: Plastics;  Laterality: Bilateral;  3.5 hours   CESAREAN SECTION     x2   REDUCTION MAMMAPLASTY Bilateral 12/2019   WISDOM TOOTH EXTRACTION  07/01/2022    OB History      Gravida  2   Para  2   Term  2   Preterm      AB      Living  2      SAB      IAB      Ectopic      Multiple      Live Births  2            Home Medications    Prior to Admission medications   Medication Sig Start Date End Date Taking? Authorizing Provider  amoxicillin (AMOXIL) 875 MG tablet Take 1 tablet (875 mg total) by mouth 2 (two) times daily for 10 days. 11/10/22 11/20/22 Yes Sharion Balloon, NP  acetaminophen (TYLENOL) 325 MG tablet Take 650 mg by mouth as needed.    [provider]  busPIRone (BUSPAR) 15 MG tablet Take 0.5-1 tablets (7.5-15 mg total) by mouth 2 (two) times daily as needed. 01/27/22   Delsa Grana, PA-C  Cholecalciferol (VITAMIN D3) 50 MCG (2000 UT) capsule Take 1 capsule (2,000 Units total) by mouth daily. 07/23/21   Delsa Grana, PA-C  escitalopram (LEXAPRO) 20 MG tablet Take 1 tablet (20 mg total) by mouth daily. 01/27/22   Delsa Grana, PA-C  fluticasone (FLONASE) 50 MCG/ACT nasal spray SHAKE LIQUID AND USE 2 SPRAYS IN [facility] NOSTRIL DAILY 07/26/22   Delsa Grana, PA-C  ibuprofen (ADVIL,MOTRIN) 200 MG tablet Take 400 mg by mouth  as needed.    [provider]  levocetirizine (XYZAL) 5 MG tablet TAKE 1 TABLET(5 MG) BY MOUTH EVERY EVENING 01/27/22   Delsa Grana, PA-C  levonorgestrel (MIRENA) 20 MCG/24HR IUD 1 Intra Uterine Device by Intrauterine route continuous.    [provider]  lisdexamfetamine (VYVANSE) 30 MG capsule Take 1 capsule (30 mg total) by mouth daily. 08/16/22   Delsa Grana, PA-C  metroNIDAZOLE (FLAGYL) 500 MG tablet Take 1 tablet (500 mg total) by mouth 2 (two) times daily. 10/16/22   Dominic, Nunzio Cobbs, CNM  nirmatrelvir & ritonavir (PAXLOVID, 300/100,) 20 x 150 MG & 10 x '100MG'$  TBPK Take as directed Patient not taking: Reported on 08/30/2022 08/17/22   Raylene Everts, MD  predniSONE (STERAPRED UNI-PAK 21 TAB) 10 MG (21) TBPK tablet Take per package directions. Patient not taking: Reported on 09/08/2022  08/30/22   Myles Gip, DO    Family History Family History  Problem Relation Age of Onset   Diabetes Mother    Cancer Mother 24       lymphoma   Diabetes Father    Pulmonary fibrosis Father    Diabetes Sister    Diabetes Brother    Dementia Maternal Grandmother    Alzheimer's disease Maternal Grandfather    Breast cancer Neg Hx     Social History Social History   Tobacco Use   Smoking status: Never   Smokeless tobacco: Never  Vaping Use   Vaping Use: Never used  Substance Use Topics   Alcohol use: Not Currently   Drug use: No     Allergies   Patient has no known allergies.   Review of Systems Review of Systems  Constitutional:  Negative for chills and fever.  HENT:  Positive for ear pain and sore throat.   Respiratory:  Negative for cough and shortness of breath.   Cardiovascular:  Negative for chest pain and palpitations.  Gastrointestinal:  Negative for diarrhea and vomiting.  Skin:  Negative for color change and rash.  All other systems reviewed and are negative.    Physical Exam Triage Vital Signs ED Triage Vitals  Enc Vitals Group     BP      Pulse      Resp      Temp      Temp src      SpO2      Weight      Height      Head Circumference      Peak Flow      Pain Score      Pain Loc      Pain Edu?      Excl. in [place]?    No data found.  Updated Vital Signs BP 118/83   Pulse 87   Temp 98.5 F (36.9 C)   Resp 18   Ht '5\' 3"'$  (1.6 m)   Wt 185 lb (83.9 kg)   SpO2 97%   BMI 32.77 kg/m   Visual Acuity Right Eye Distance:   Left Eye Distance:   Bilateral Distance:    Right Eye Near:   Left Eye Near:    Bilateral Near:     Physical Exam Vitals and nursing note reviewed.  Constitutional:      General: She is not in acute distress.    Appearance: Normal appearance. She is well-developed. She is not ill-appearing.  HENT:     Head: Normocephalic and atraumatic.     Right Ear: Tympanic membrane and ear  canal normal.     Left  Ear: Ear canal normal. Tympanic membrane is erythematous.     Nose: Nose normal.     Mouth/Throat:     Mouth: Mucous membranes are moist.     Pharynx: Oropharynx is clear.  Cardiovascular:     Rate and Rhythm: Normal rate and regular rhythm.     Heart sounds: Normal heart sounds.  Pulmonary:     Effort: Pulmonary effort is normal. No respiratory distress.     Breath sounds: Normal breath sounds.  Musculoskeletal:     Cervical back: Neck supple.  Skin:    General: Skin is warm and dry.  Neurological:     Mental Status: She is alert.  Psychiatric:        Mood and Affect: Mood normal.        Behavior: Behavior normal.      UC Treatments / Results  Labs (all labs ordered are listed, but only abnormal results are displayed) Labs Reviewed - No data to display  EKG   Radiology No results found.  Procedures Procedures (including critical care time)  Medications Ordered in UC Medications - No data to display  Initial Impression / Assessment and Plan / UC Course  I have reviewed the triage vital signs and the nursing notes.  Pertinent labs & imaging results that were available during my care of the patient were reviewed by me and considered in my medical decision making (see chart for details).    Left otitis media.  Treating with amoxicillin.  Discussed symptomatic treatment including Tylenol or ibuprofen.  Instructed patient to follow up with her PCP if symptoms are not improving.  She agrees to plan of care.   Final Clinical Impressions(s) / UC Diagnoses   Final diagnoses:  Left otitis media, unspecified otitis media type     Discharge Instructions      Take the amoxicillin as directed.  Follow up with your primary care provider if your symptoms are not improving.        ED Prescriptions     Medication Sig Dispense Auth. Provider   amoxicillin (AMOXIL) 875 MG tablet Take 1 tablet (875 mg total) by mouth 2 (two) times daily for 10 days. 20 tablet Sharion Balloon, NP      PDMP not reviewed this encounter.   Sharion Balloon, NP 11/10/22 1827

## 2022-11-10 NOTE — ED Triage Notes (Signed)
Patient to Urgent Care with complaints of bilateral ear fullness and pain x2 weeks. Reports left ear pain radiates down into her throat. States she constantly feeling fluid in her ears.   Has been using allergy meds/ flonase/ ibuprofen.   Denies any known fevers.

## 2022-11-10 NOTE — Discharge Instructions (Addendum)
Take the amoxicillin as directed.  Follow up with your primary care provider if your symptoms are not improving.   ° ° °

## 2022-11-12 ENCOUNTER — Other Ambulatory Visit: Payer: Self-pay | Admitting: Family Medicine

## 2022-11-12 DIAGNOSIS — F988 Other specified behavioral and emotional disorders with onset usually occurring in childhood and adolescence: Secondary | ICD-10-CM

## 2022-11-12 NOTE — Telephone Encounter (Signed)
Medication Refill - Medication: lisdexamfetamine (VYVANSE) 30 MG capsule   Has the patient contacted their pharmacy? Yes.   Pt told to contact provider  Preferred Pharmacy (with phone number or street name):  [id number] - [place], [address] [place] Phone: [phone]  Fax: [phone]     Has the patient been seen for an appointment in the last year OR does the patient have an upcoming appointment? Yes.    Agent: Please be advised that RX refills may take up to 3 business days. We ask that you follow-up with your pharmacy.

## 2022-11-15 ENCOUNTER — Ambulatory Visit: Payer: BC Managed Care – PPO | Admitting: Family Medicine

## 2022-11-15 NOTE — Telephone Encounter (Signed)
Requested medications are due for refill today.  yes  Requested medications are on the active medications list.  yes  Last refill. 08/16/2022 #30 0 rf  Future visit scheduled.   yes  Notes to clinic.  Refill not delegated.    Requested Prescriptions  Pending Prescriptions Disp Refills   lisdexamfetamine (VYVANSE) 30 MG capsule 30 capsule 0    Sig: Take 1 capsule (30 mg total) by mouth daily.     Not Delegated - Psychiatry:  Stimulants/ADHD Failed - 11/12/2022 12:43 PM      Failed - This refill cannot be delegated      Failed - Urine Drug Screen completed in last 360 days      Passed - Last BP in normal range    BP Readings from Last 1 Encounters:  11/10/22 118/83         Passed - Last Heart Rate in normal range    Pulse Readings from Last 1 Encounters:  11/10/22 87         Passed - Valid encounter within last 6 months    Recent Outpatient Visits           2 months ago Viral URI   [place], DO   3 months ago Annual physical exam   [facility] Delsa Grana, PA-C   9 months ago Acute otalgia, left   [facility] Delsa Grana, PA-C   1 year ago Annual physical exam   [facility] Delsa Grana, PA-C   1 year ago Current moderate episode of major depressive disorder without prior episode [facility])   [facility] Delsa Grana, PA-C       Future Appointments             In 3 weeks Delsa Grana, [facility], [facility]

## 2022-11-16 ENCOUNTER — Ambulatory Visit: Payer: BC Managed Care – PPO | Admitting: Obstetrics and Gynecology

## 2022-11-16 ENCOUNTER — Ambulatory Visit (INDEPENDENT_AMBULATORY_CARE_PROVIDER_SITE_OTHER): Payer: BC Managed Care – PPO | Admitting: Obstetrics and Gynecology

## 2022-11-16 ENCOUNTER — Other Ambulatory Visit (HOSPITAL_COMMUNITY)
Admission: RE | Admit: 2022-11-16 | Discharge: 2022-11-16 | Disposition: A | Discharge status: Home / Self Care | Payer: BC Managed Care – PPO | Source: Ambulatory Visit | Attending: Obstetrics and Gynecology | Admitting: Obstetrics and Gynecology

## 2022-11-16 VITALS — BP 116/77 | HR 112 | Resp 16 | Ht 63.5 in | Wt 195.0 lb

## 2022-11-16 DIAGNOSIS — R87810 Cervical high risk human papillomavirus (HPV) DNA test positive: Secondary | ICD-10-CM

## 2022-11-16 DIAGNOSIS — N870 Mild cervical dysplasia: Secondary | ICD-10-CM | POA: Diagnosis not present

## 2022-11-16 DIAGNOSIS — R87610 Atypical squamous cells of undetermined significance on cytologic smear of cervix (ASC-US): Secondary | ICD-10-CM | POA: Diagnosis not present

## 2022-11-16 DIAGNOSIS — N879 Dysplasia of cervix uteri, unspecified: Secondary | ICD-10-CM | POA: Diagnosis not present

## 2022-11-16 MED ORDER — LISDEXAMFETAMINE DIMESYLATE 30 MG PO CAPS: 30.0000 mg | ORAL_CAPSULE | Freq: Every day | ORAL | 0 refills | Status: DC

## 2022-11-16 NOTE — Progress Notes (Signed)
    GYNECOLOGY OFFICE COLPOSCOPY PROCEDURE NOTE  42 y.o. [id number] here for colposcopy for ASCUS with POSITIVE high risk HPV pap smear on 10/14/2022. Discussed role for HPV in cervical dysplasia, need for surveillance.  Patient gave informed written consent, time out was performed.  Placed in lithotomy position. Cervix viewed with speculum and colposcope after application of acetic acid.   Colposcopy adequate? Yes  no mosaicism, no punctation, no abnormal vasculature, and acetowhite lesion(s) noted at 9 o'clock; corresponding biopsies obtained.  ECC specimen obtained. All specimens were labeled and sent to pathology.  Chaperone was present during entire procedure.  Patient was given post procedure instructions.  Will follow up pathology and manage accordingly; patient will be contacted with results and recommendations.  Routine preventative health maintenance measures emphasized.   Rubie Maid, MD [facility] at [facility]

## 2022-11-18 ENCOUNTER — Encounter: Payer: Self-pay | Admitting: Obstetrics and Gynecology

## 2022-11-18 LAB — SURGICAL PATHOLOGY

## 2022-11-23 DIAGNOSIS — H6122 Impacted cerumen, left ear: Secondary | ICD-10-CM | POA: Diagnosis not present

## 2022-11-23 DIAGNOSIS — H60543 Acute eczematoid otitis externa, bilateral: Secondary | ICD-10-CM | POA: Diagnosis not present

## 2022-12-06 ENCOUNTER — Ambulatory Visit: Payer: BC Managed Care – PPO | Admitting: Family Medicine

## 2022-12-06 ENCOUNTER — Encounter: Payer: Self-pay | Admitting: Family Medicine

## 2022-12-06 VITALS — BP 118/76 | HR 95 | Temp 98.0°F | Resp 16 | Ht 63.5 in | Wt 190.6 lb

## 2022-12-06 DIAGNOSIS — F321 Major depressive disorder, single episode, moderate: Secondary | ICD-10-CM | POA: Diagnosis not present

## 2022-12-06 DIAGNOSIS — F419 Anxiety disorder, unspecified: Secondary | ICD-10-CM

## 2022-12-06 DIAGNOSIS — F988 Other specified behavioral and emotional disorders with onset usually occurring in childhood and adolescence: Secondary | ICD-10-CM

## 2022-12-06 DIAGNOSIS — K219 Gastro-esophageal reflux disease without esophagitis: Secondary | ICD-10-CM

## 2022-12-06 MED ORDER — LISDEXAMFETAMINE DIMESYLATE 30 MG PO CAPS: 30.0000 mg | ORAL_CAPSULE | Freq: Every day | ORAL | 0 refills | Status: DC

## 2022-12-06 MED ORDER — BUSPIRONE HCL 15 MG PO TABS: 7.5000 mg | ORAL_TABLET | Freq: Two times a day (BID) | ORAL | 3 refills | Status: AC | PRN

## 2022-12-06 NOTE — Progress Notes (Signed)
Name: Laura Watson   MRN: [id number]    DOB: 1980/10/30   Date:12/06/2022       Progress Note  Chief Complaint  Patient presents with   Follow-up   Depression   Anxiety   Gastroesophageal Reflux     Subjective:   Laura Watson is a 42 y.o. female, presents to clinic for med refil/ADHD   ADHD: Compliant with meds: yes  Pt is taking vyvanse started by specialists Benefit from med (ie increase in concentration): Yes Pt denies CP, rapid HR, dry mouth, weight loss, difficulty sleeping, agitation/mood disturbances.  Reviewed VS today with stimulant meds in adult: BP Readings from Last 3 Encounters:  12/06/22 118/76  11/16/22 116/77  11/10/22 118/83    Pulse Readings from Last 3 Encounters:  12/06/22 95  11/16/22 (!) 112  11/10/22 87    PDMP:  Controlled Substance Database was reviewed today, reviewed and no suspicious findings.  Behind on med refills due to supply issues       Current Outpatient Medications:    acetaminophen (TYLENOL) 325 MG tablet, Take 650 mg by mouth as needed., Disp: , Rfl:    busPIRone (BUSPAR) 15 MG tablet, Take 0.5-1 tablets (7.5-15 mg total) by mouth 2 (two) times daily as needed., Disp: 180 tablet, Rfl: 3   Cholecalciferol (VITAMIN D3) 50 MCG (2000 UT) capsule, Take 1 capsule (2,000 Units total) by mouth daily., Disp: , Rfl:    escitalopram (LEXAPRO) 20 MG tablet, Take 1 tablet (20 mg total) by mouth daily., Disp: 90 tablet, Rfl: 3   fluticasone (FLONASE) 50 MCG/ACT nasal spray, SHAKE LIQUID AND USE 2 SPRAYS IN EACH NOSTRIL DAILY, Disp: 16 g, Rfl: 5   ibuprofen (ADVIL,MOTRIN) 200 MG tablet, Take 400 mg by mouth as needed., Disp: , Rfl:    levocetirizine (XYZAL) 5 MG tablet, TAKE 1 TABLET(5 MG) BY MOUTH EVERY EVENING, Disp: 90 tablet, Rfl: 3   levonorgestrel (MIRENA) 20 MCG/24HR IUD, 1 Intra Uterine Device by Intrauterine route continuous., Disp: , Rfl:    lisdexamfetamine (VYVANSE) 30 MG capsule, Take 1 capsule (30 mg total) by  mouth daily., Disp: 30 capsule, Rfl: 0   metroNIDAZOLE (FLAGYL) 500 MG tablet, Take 1 tablet (500 mg total) by mouth 2 (two) times daily. (Patient not taking: Reported on 12/06/2022), Disp: 14 tablet, Rfl: 0   nirmatrelvir & ritonavir (PAXLOVID, 300/100,) 20 x 150 MG & 10 x 100MG TBPK, Take as directed (Patient not taking: Reported on 12/06/2022), Disp: 1 each, Rfl: 0   predniSONE (STERAPRED UNI-PAK 21 TAB) 10 MG (21) TBPK tablet, Take per package directions. (Patient not taking: Reported on 12/06/2022), Disp: 21 tablet, Rfl: 0  Patient Active Problem List   Diagnosis Date Noted   Pelvic pain 08/30/2022   Dermatitis 08/30/2022   Dysuria 08/30/2022   Pain in joint, forearm 08/30/2022   Screening for malignant neoplasm of cervix 08/30/2022   Sore throat 08/30/2022   Prediabetes 07/27/2022   Insomnia 01/27/2022   Allergic rhinitis 01/27/2022   Gastroesophageal reflux disease 01/27/2022   Attention deficit disorder 01/27/2022   ASCUS with positive high risk HPV cervical 08/07/2021   Class 1 obesity with serious comorbidity and body mass index (BMI) of 33.0 to 33.9 in adult 04/09/2021   Vitamin D deficiency 04/09/2021   Anxiety disorder 01/16/2018   Current moderate episode of major depressive disorder without prior episode ([place]) 01/16/2018   Family history of diabetes mellitus 01/16/2018    Past Surgical History:  Procedure Laterality  Date   APPENDECTOMY  2009   BREAST REDUCTION SURGERY Bilateral 01/16/2020   Procedure: MAMMARY REDUCTION  (BREAST);  Surgeon: Wallace Going, DO;  Location: [facility];  Service: Plastics;  Laterality: Bilateral;  3.5 hours   CESAREAN SECTION     x2   REDUCTION MAMMAPLASTY Bilateral 12/2019   WISDOM TOOTH EXTRACTION  07/01/2022    Family History  Problem Relation Age of Onset   Diabetes Mother    Cancer Mother 48       lymphoma   Diabetes Father    Pulmonary fibrosis Father    Diabetes Sister    Diabetes Brother    Dementia  Maternal Grandmother    Alzheimer's disease Maternal Grandfather    Breast cancer Neg Hx     Social History   Tobacco Use   Smoking status: Never   Smokeless tobacco: Never  Vaping Use   Vaping Use: Never used  Substance Use Topics   Alcohol use: Not Currently   Drug use: No     No Known Allergies  Health Maintenance  Topic Date Due   COVID-19 Vaccine (3 - 2023-24 season) 12/22/2022 (Originally 06/25/2022)   MAMMOGRAM  09/11/2023   PAP SMEAR-Modifier  10/14/2025   DTaP/Tdap/Td (2 - Td or Tdap) 01/17/2029   Hepatitis C Screening  Completed   HIV Screening  Completed   HPV VACCINES  Aged Out   INFLUENZA VACCINE  Discontinued    Chart Review Today: I personally reviewed active problem list, medication list, allergies, family history, social history, health maintenance, notes from last encounter, lab results, imaging with the patient/caregiver today.   Review of Systems  Constitutional: Negative.   HENT: Negative.    Eyes: Negative.   Respiratory: Negative.    Cardiovascular: Negative.   Gastrointestinal: Negative.   Endocrine: Negative.   Genitourinary: Negative.   Musculoskeletal: Negative.   Skin: Negative.   Allergic/Immunologic: Negative.   Neurological: Negative.   Hematological: Negative.   Psychiatric/Behavioral: Negative.    All other systems reviewed and are negative.    Objective:   Vitals:   12/06/22 1522  BP: 118/76  Pulse: 95  Resp: 16  Temp: 98 F (36.7 C)  TempSrc: Oral  SpO2: 99%  Weight: 190 lb 9.6 oz (86.5 kg)  Height: 5' 3.5" (1.613 m)    Body mass index is 33.23 kg/m.  Physical Exam Vitals and nursing note reviewed.  Constitutional:      Appearance: She is well-developed.  HENT:     Head: Normocephalic and atraumatic.     Nose: Nose normal.  Eyes:     General:        Right eye: No discharge.        Left eye: No discharge.     Conjunctiva/sclera: Conjunctivae normal.  Neck:     Trachea: No tracheal deviation.   Cardiovascular:     Rate and Rhythm: Normal rate and regular rhythm.     Pulses: Normal pulses.     Heart sounds: Normal heart sounds.  Pulmonary:     Effort: Pulmonary effort is normal. No respiratory distress.     Breath sounds: Normal breath sounds. No stridor.  Musculoskeletal:        General: Normal range of motion.  Skin:    General: Skin is warm and dry.     Findings: No rash.  Neurological:     Mental Status: She is alert.     Motor: No abnormal muscle tone.  Coordination: Coordination normal.  Psychiatric:        Mood and Affect: Mood normal.        Behavior: Behavior normal.         Assessment & Plan:   Problem List Items Addressed This Visit       Other   Anxiety disorder    Mood and anxiety good, well controlled with buspar      Relevant Medications   busPIRone (BUSPAR) 15 MG tablet   Current moderate episode of major depressive disorder without prior episode ([place])       12/06/2022    3:27 PM 08/30/2022    8:41 AM 07/26/2022    3:27 PM  Depression screen PHQ 2/9  Decreased Interest 0 0 0  Down, Depressed, Hopeless 0 0 0  PHQ - 2 Score 0 0 0  Altered sleeping 0 0 0  Tired, decreased energy 0 0 0  Change in appetite 0 0 0  Feeling bad or failure about yourself  0 0 0  Trouble concentrating 0 0 0  Moving slowly or fidgety/restless 0 0 0  Suicidal thoughts 0 0 0  PHQ-9 Score 0 0 0  Difficult doing work/chores Not difficult at all  Not difficult at all  Phq 9 neg, reviewed      Relevant Medications   busPIRone (BUSPAR) 15 MG tablet   Attention deficit disorder - Primary    See below - but otherwise meds effective w/o SE or concerns, BP/HR good today Controlled substance database reviewed With supply difficulty may need to be in close contact for refills - encouraged pt to check pharmacies and notify us where she needs them sent      Relevant Medications   lisdexamfetamine (VYVANSE) 30 MG capsule       Difficulty with supply of meds,  though otherwise she has done well on vyvanse 30 mg and it works well w/o SE If unable to get we may try IR adderral and shopping around to other pharmacies for availability   Return in about 3 months (around 03/06/2023) for Virtual ADHD med f/up .   Delsa Grana, PA-C 12/06/22 3:44 PM

## 2022-12-08 ENCOUNTER — Encounter: Payer: Self-pay | Admitting: Family Medicine

## 2022-12-08 DIAGNOSIS — H60542 Acute eczematoid otitis externa, left ear: Secondary | ICD-10-CM | POA: Diagnosis not present

## 2022-12-08 NOTE — Assessment & Plan Note (Signed)
    12/06/2022    3:27 PM 08/30/2022    8:41 AM 07/26/2022    3:27 PM  Depression screen PHQ 2/9  Decreased Interest 0 0 0  Down, Depressed, Hopeless 0 0 0  PHQ - 2 Score 0 0 0  Altered sleeping 0 0 0  Tired, decreased energy 0 0 0  Change in appetite 0 0 0  Feeling bad or failure about yourself  0 0 0  Trouble concentrating 0 0 0  Moving slowly or fidgety/restless 0 0 0  Suicidal thoughts 0 0 0  PHQ-9 Score 0 0 0  Difficult doing work/chores Not difficult at all  Not difficult at all  Phq 9 neg, reviewed

## 2022-12-08 NOTE — Assessment & Plan Note (Signed)
See below - but otherwise meds effective w/o SE or concerns, BP/HR good today Controlled substance database reviewed With supply difficulty may need to be in close contact for refills - encouraged pt to check pharmacies and notify us where she needs them sent

## 2022-12-08 NOTE — Assessment & Plan Note (Signed)
Mood and anxiety good, well controlled with buspar

## 2022-12-10 ENCOUNTER — Encounter: Payer: Self-pay | Admitting: Family Medicine

## 2023-02-07 ENCOUNTER — Encounter: Payer: Self-pay | Admitting: Family Medicine

## 2023-02-07 DIAGNOSIS — F419 Anxiety disorder, unspecified: Secondary | ICD-10-CM

## 2023-02-07 DIAGNOSIS — F321 Major depressive disorder, single episode, moderate: Secondary | ICD-10-CM

## 2023-02-07 MED ORDER — ESCITALOPRAM OXALATE 20 MG PO TABS: 20.0000 mg | ORAL_TABLET | Freq: Every day | ORAL | 3 refills | Status: DC

## 2023-03-07 ENCOUNTER — Telehealth (INDEPENDENT_AMBULATORY_CARE_PROVIDER_SITE_OTHER): Payer: BC Managed Care – PPO | Admitting: Family Medicine

## 2023-03-07 DIAGNOSIS — F988 Other specified behavioral and emotional disorders with onset usually occurring in childhood and adolescence: Secondary | ICD-10-CM

## 2023-03-07 MED ORDER — LISDEXAMFETAMINE DIMESYLATE 30 MG PO CAPS: 30.0000 mg | ORAL_CAPSULE | Freq: Every day | ORAL | 0 refills | Status: DC

## 2023-03-07 NOTE — Patient Instructions (Signed)
Please let me know if the medication prescription is not available or affordable.  You can call other pharmacies or check [email or web address] for best cash prices if things are not working out with your health insurance

## 2023-03-07 NOTE — Progress Notes (Signed)
Name: Laura Watson   MRN: [id number]    DOB: October 19, 1981   Date:03/07/2023       Progress Note  Subjective:    I connected with  Laura Watson  on 03/07/23 at  1:40 PM EDT by a video enabled telemedicine application and verified that I am speaking with the correct person using two identifiers.  I discussed the limitations of evaluation and management by telemedicine and the availability of in person appointments. The patient expressed understanding and agreed to proceed. Staff also discussed with the patient that there may be a patient responsible charge related to this service. Patient Location: work Provider Location: [facility] office Additional Individuals present: none  Chief Complaint  Patient presents with   Follow-up   ADHD    Catelin Vogen is a 42 y.o. female, presents for virtual visit for routine follow up on the conditions listed above.  Med refill for ADHD meds, on vyvanse last and it was working well She has been out for a few months Would like to get refill of generic vyvanse with same dose No other SE or concerns right now PDMP reviewed and chart - no Rx since Feb 2024  Patient Active Problem List   Diagnosis Date Noted   Pelvic pain 08/30/2022   Dermatitis 08/30/2022   Dysuria 08/30/2022   Pain in joint, forearm 08/30/2022   Screening for malignant neoplasm of cervix 08/30/2022   Sore throat 08/30/2022   Prediabetes 07/27/2022   Insomnia 01/27/2022   Allergic rhinitis 01/27/2022   Gastroesophageal reflux disease 01/27/2022   Attention deficit disorder 01/27/2022   ASCUS with positive high risk HPV cervical 08/07/2021   Class 1 obesity with serious comorbidity and body mass index (BMI) of 33.0 to 33.9 in adult 04/09/2021   Vitamin D deficiency 04/09/2021   Anxiety disorder 01/16/2018   Current moderate episode of major depressive disorder without prior episode (HCC) 01/16/2018   Family history of diabetes mellitus 01/16/2018     Current Outpatient Medications:    acetaminophen (TYLENOL) 325 MG tablet, Take 650 mg by mouth as needed., Disp: , Rfl:    busPIRone (BUSPAR) 15 MG tablet, Take 0.5-1 tablets (7.5-15 mg total) by mouth 2 (two) times daily as needed., Disp: 180 tablet, Rfl: 3   Cholecalciferol (VITAMIN D3) 50 MCG (2000 UT) capsule, Take 1 capsule (2,000 Units total) by mouth daily., Disp: , Rfl:    escitalopram (LEXAPRO) 20 MG tablet, Take 1 tablet (20 mg total) by mouth daily., Disp: 90 tablet, Rfl: 3   fluticasone (FLONASE) 50 MCG/ACT nasal spray, SHAKE LIQUID AND USE 2 SPRAYS IN EACH NOSTRIL DAILY, Disp: 16 g, Rfl: 5   ibuprofen (ADVIL,MOTRIN) 200 MG tablet, Take 400 mg by mouth as needed., Disp: , Rfl:    levocetirizine (XYZAL) 5 MG tablet, TAKE 1 TABLET(5 MG) BY MOUTH EVERY EVENING, Disp: 90 tablet, Rfl: 3   levonorgestrel (MIRENA) 20 MCG/24HR IUD, 1 Intra Uterine Device by Intrauterine route continuous., Disp: , Rfl:    lisdexamfetamine (VYVANSE) 30 MG capsule, Take 1 capsule (30 mg total) by mouth daily., Disp: 30 capsule, Rfl: 0   lisdexamfetamine (VYVANSE) 30 MG capsule, Take 1 capsule (30 mg total) by mouth daily., Disp: 30 capsule, Rfl: 0 No Known Allergies  Past Surgical History:  Procedure Laterality Date   APPENDECTOMY  2009   BREAST REDUCTION SURGERY Bilateral 01/16/2020   Procedure: MAMMARY REDUCTION  (BREAST);  Surgeon: Peggye Form, DO;  Location: [facility];  Service: Plastics;  Laterality: Bilateral;  3.5 hours   CESAREAN SECTION     x2   REDUCTION MAMMAPLASTY Bilateral 12/2019   WISDOM TOOTH EXTRACTION  07/01/2022   Family History  Problem Relation Age of Onset   Diabetes Mother    Cancer Mother 4       lymphoma   Diabetes Father    Pulmonary fibrosis Father    Diabetes Sister    Diabetes Brother    Dementia Maternal Grandmother    Alzheimer's disease Maternal Grandfather    Breast cancer Neg Hx    Social History   Socioeconomic History    Marital status: Married    Spouse name: Not on file   Number of children: 2   Years of education: Not on file   Highest education level: Some college, no degree  Occupational History   Not on file  Tobacco Use   Smoking status: Never   Smokeless tobacco: Never  Vaping Use   Vaping Use: Never used  Substance and Sexual Activity   Alcohol use: Not Currently   Drug use: No   Sexual activity: Yes    Partners: Male    Birth control/protection: I.U.D.    Comment: Mirena  Other Topics Concern   Not on file  Social History Narrative   Works at [employer] in [employer].   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   Getting married Nov   Social Determinants of Health   Financial Resource Strain: Low Risk  (07/26/2022)   Overall Financial Resource Strain (CARDIA)    Difficulty of Paying Living Expenses: Not hard at all  Food Insecurity: No Food Insecurity (07/26/2022)   Hunger Vital Sign    Worried About Running Out of Food in the Last Year: Never true    Ran Out of Food in the Last Year: Never true  Transportation Needs: No Transportation Needs (07/26/2022)   PRAPARE - Administrator, Civil Service (Medical): No    Lack of Transportation (Non-Medical): No  Physical Activity: Insufficiently Active (07/26/2022)   Exercise Vital Sign    Days of Exercise per Week: 3 days    Minutes of Exercise per Session: 40 min  Stress: No Stress Concern Present (07/26/2022)   [employer] of Occupational Health - Occupational Stress Questionnaire    Feeling of Stress : Only a little  Social Connections: Moderately Isolated (07/26/2022)   Social Connection and Isolation Panel [NHANES]    Frequency of Communication with Friends and Family: Twice a week    Frequency of Social Gatherings with Friends and Family: Once a week    Attends Religious Services: Never    Database administrator or Organizations: No    Attends Tax inspector Meetings: Never    Marital Status: Married  Catering manager Violence: Not At Risk (07/26/2022)   Humiliation, Afraid, Rape, and Kick questionnaire    Fear of Current or Ex-Partner: No    Emotionally Abused: No    Physically Abused: No    Sexually Abused: No    Chart Review Today: I personally reviewed active problem list, medication list, allergies, family history, social history, health maintenance, notes from last encounter, lab results, imaging with the patient/caregiver today.   Review of Systems  Constitutional: Negative.   HENT: Negative.    Eyes: Negative.   Respiratory: Negative.    Cardiovascular: Negative.   Gastrointestinal: Negative.   Endocrine: Negative.  Genitourinary: Negative.   Musculoskeletal: Negative.   Skin: Negative.   Allergic/Immunologic: Negative.   Neurological: Negative.   Hematological: Negative.   Psychiatric/Behavioral: Negative.    All other systems reviewed and are negative.     Objective:    Virtual encounter, vitals limited, only able to obtain the following There were no vitals filed for this visit. There is no height or weight on file to calculate BMI. Nursing Note and Vital Signs reviewed.  Physical Exam Vitals and nursing note reviewed.  Constitutional:      Comments: Well appearing female, no acute distress  Pulmonary:     Effort: No respiratory distress.  Neurological:     Mental Status: She is alert.  Psychiatric:        Mood and Affect: Mood normal.     PE limited by telephone encounter  No results found for this or any previous visit (from the past 72 hour(s)).  PHQ2/9:    03/07/2023    9:31 AM 12/06/2022    3:27 PM 08/30/2022    8:41 AM 07/26/2022    3:27 PM 01/27/2022    2:27 PM  Depression screen PHQ 2/9  Decreased Interest 0 0 0 0 0  Down, Depressed, Hopeless 0 0 0 0 0  PHQ - 2 Score 0 0 0 0 0  Altered sleeping 0 0 0 0 0  Tired, decreased energy 0 0 0 0 0  Change in appetite 0 0 0 0 0   Feeling bad or failure about yourself  0 0 0 0 0  Trouble concentrating 0 0 0 0 0  Moving slowly or fidgety/restless 0 0 0 0 0  Suicidal thoughts 0 0 0 0 0  PHQ-9 Score 0 0 0 0 0  Difficult doing work/chores Not difficult at all Not difficult at all  Not difficult at all Not difficult at all   PHQ-2/9 Result is neg, reviewed today  Fall Risk:    03/07/2023    9:30 AM 12/06/2022    3:22 PM 08/30/2022    8:41 AM 07/26/2022    3:27 PM 01/27/2022    2:24 PM  Fall Risk   Falls in the past year? 0 0 0 0 0  Number falls in past yr: 0 0  0 0  Injury with Fall? 0 0  0 0  Risk for fall due to : No Fall Risks No Fall Risks No Fall Risks No Fall Risks   Follow up Falls prevention discussed;Education provided;Falls evaluation completed Falls prevention discussed;Education provided;Falls evaluation completed Falls prevention discussed Falls prevention discussed;Education provided      Assessment and Plan:     ICD-10-CM   1. Attention deficit disorder, unspecified hyperactivity presence  F98.8     She's been unable to get refills from her pharmacy for several months, was doing well on vyvanse generic 30 mg daily, a psychiatrist and ADHD specialists did her eval previously, gave dx and started meds, initially was on 20 mg and after a few months increased to 30 and PCP took over med management. She tolerated vyvanse 30 mg well previously, no SE or concerns Refills sent to her pharmacy after reviewing database, she will let us know if there was a problem getting the RX  I discussed the assessment and treatment plan with the patient. The patient was provided an opportunity to ask questions and all were answered. The patient agreed with the plan and demonstrated an understanding of the instructions.  The patient was advised to  call back or seek an in-person evaluation if the symptoms worsen or if the condition fails to improve as anticipated.  She will need in person f/up in 3 months for med  check/refill  I provided 18 minutes of non-face-to-face time during this encounter.  Danelle Berry, PA-C 03/07/23 2:09 PM

## 2023-06-09 ENCOUNTER — Ambulatory Visit: Payer: BC Managed Care – PPO | Admitting: Family Medicine

## 2023-06-20 ENCOUNTER — Encounter: Payer: Self-pay | Admitting: Family Medicine

## 2023-06-21 ENCOUNTER — Ambulatory Visit: Payer: BC Managed Care – PPO | Admitting: Family Medicine

## 2023-06-21 DIAGNOSIS — F419 Anxiety disorder, unspecified: Secondary | ICD-10-CM

## 2023-06-21 DIAGNOSIS — F988 Other specified behavioral and emotional disorders with onset usually occurring in childhood and adolescence: Secondary | ICD-10-CM

## 2023-06-21 DIAGNOSIS — F321 Major depressive disorder, single episode, moderate: Secondary | ICD-10-CM

## 2023-07-06 ENCOUNTER — Encounter: Payer: Self-pay | Admitting: Family Medicine

## 2023-07-06 ENCOUNTER — Ambulatory Visit (INDEPENDENT_AMBULATORY_CARE_PROVIDER_SITE_OTHER): Payer: BC Managed Care – PPO | Admitting: Family Medicine

## 2023-07-06 VITALS — BP 116/74 | HR 87 | Temp 97.5°F | Resp 16 | Ht 63.5 in | Wt 171.3 lb

## 2023-07-06 DIAGNOSIS — R7303 Prediabetes: Secondary | ICD-10-CM

## 2023-07-06 DIAGNOSIS — F3340 Major depressive disorder, recurrent, in remission, unspecified: Secondary | ICD-10-CM

## 2023-07-06 DIAGNOSIS — F988 Other specified behavioral and emotional disorders with onset usually occurring in childhood and adolescence: Secondary | ICD-10-CM

## 2023-07-06 DIAGNOSIS — R21 Rash and other nonspecific skin eruption: Secondary | ICD-10-CM

## 2023-07-06 DIAGNOSIS — E785 Hyperlipidemia, unspecified: Secondary | ICD-10-CM | POA: Insufficient documentation

## 2023-07-06 DIAGNOSIS — E663 Overweight: Secondary | ICD-10-CM | POA: Insufficient documentation

## 2023-07-06 DIAGNOSIS — F419 Anxiety disorder, unspecified: Secondary | ICD-10-CM

## 2023-07-06 MED ORDER — LISDEXAMFETAMINE DIMESYLATE 30 MG PO CAPS: 30.0000 mg | ORAL_CAPSULE | Freq: Every day | ORAL | 0 refills | Status: DC

## 2023-07-06 MED ORDER — CLOBETASOL PROPIONATE 0.05 % EX OINT: 1.0000 | TOPICAL_OINTMENT | Freq: Two times a day (BID) | CUTANEOUS | 1 refills | Status: DC

## 2023-07-06 NOTE — Assessment & Plan Note (Signed)
Sx stable and well controlled on lexapro 20 and buspar 15 mg TID prn - taking usually once a day    07/06/2023    8:48 AM 03/07/2023    9:31 AM 12/06/2022    3:27 PM 08/30/2022    8:42 AM  GAD 7 : Generalized Anxiety Score  Nervous, Anxious, on Edge 0 0 0 0  Control/stop worrying 0 0 0 0  Worry too much - different things 0 0 0 0  Trouble relaxing 0 0 0 0  Restless 0 0 0 0  Easily annoyed or irritable 0 0 0 0  Afraid - awful might happen 0 0 0 0  Total GAD 7 Score 0 0 0 0  Anxiety Difficulty Not difficult at all Not difficult at all Not difficult at all

## 2023-07-06 NOTE — Assessment & Plan Note (Signed)
Sx well controlled, PHQ 9 reviewed and negative Continue lexapro 20    07/06/2023    8:48 AM 03/07/2023    9:31 AM 12/06/2022    3:27 PM  Depression screen PHQ 2/9  Decreased Interest 0 0 0  Down, Depressed, Hopeless 0 0 0  PHQ - 2 Score 0 0 0  Altered sleeping 0 0 0  Tired, decreased energy 0 0 0  Change in appetite 0 0 0  Feeling bad or failure about yourself  0 0 0  Trouble concentrating 0 0 0  Moving slowly or fidgety/restless 0 0 0  Suicidal thoughts 0 0 0  PHQ-9 Score 0 0 0  Difficult doing work/chores Not difficult at all Not difficult at all Not difficult at all

## 2023-07-06 NOTE — Assessment & Plan Note (Signed)
She's lost weight with doing weight watchers, working out and did a program and work/insurance where she get [employer] Readings from Last 5 Encounters:  07/06/23 171 lb 4.8 oz (77.7 kg)  12/06/22 190 lb 9.6 oz (86.5 kg)  11/16/22 195 lb (88.5 kg)  11/10/22 185 lb (83.9 kg)  10/14/22 192 lb 11.2 oz (87.4 kg)   BMI Readings from Last 5 Encounters:  07/06/23 29.87 kg/m  12/06/22 33.23 kg/m  11/16/22 34.00 kg/m  11/10/22 32.77 kg/m  10/14/22 33.60 kg/m   BMI now in overweight category, out of obese range, and she looks great. She has lost weight very slowly and gradually she states and all with a program

## 2023-07-06 NOTE — Assessment & Plan Note (Signed)
Sx stable and well controlled with vyvanse generic 30 mg Reviewed database - no concerns No concerning SE with meds Good compliance VS reviewed today Can do virtual 3 month f/up for next refills if needed

## 2023-07-06 NOTE — Assessment & Plan Note (Signed)
Lab Results  Component Value Date   CHOL 222 (A) 06/15/2022   HDL 45 06/15/2022   LDLCALC 143 06/15/2022   TRIG 190 (A) 06/15/2022   CHOLHDL 4.4 02/10/2021   Will recheck at next OV/CPE

## 2023-07-06 NOTE — Assessment & Plan Note (Signed)
With diet/lifestyle changes and weight loss we will recheck labs at next appt CPE Lab Results  Component Value Date   HGBA1C 5.9 06/15/2022

## 2023-07-06 NOTE — Progress Notes (Signed)
Name: Laura Watson   MRN: [id number]    DOB: 11-13-1980   Date:07/06/2023       Progress Note  Chief Complaint  Patient presents with   Medication Refill   Depression   Anxiety     Subjective:   Laura Watson is a 42 y.o. female, presents to clinic for med refill /routine f/up, we have taken over ADHD meds after eval and start of med mgmt from psych, mood/anxiety and reviewed pmhs, HLD and prediabetes etc  Losing weight with [employer] and doing wegovy - through work benefit and program, she's lost 20 lbs over 4 months with diet lifestyle efforts and doing wegovy 1 mg dose and tolerating SE Wt Readings from Last 5 Encounters:  07/06/23 171 lb 4.8 oz (77.7 kg)  12/06/22 190 lb 9.6 oz (86.5 kg)  11/16/22 195 lb (88.5 kg)  11/10/22 185 lb (83.9 kg)  10/14/22 192 lb 11.2 oz (87.4 kg)   BMI Readings from Last 5 Encounters:  07/06/23 29.87 kg/m  12/06/22 33.23 kg/m  11/16/22 34.00 kg/m  11/10/22 32.77 kg/m  10/14/22 33.60 kg/m    ADHD: ADHD vyvanse working well on 30 mg daily, steady refills/dosing/prescribing, no SE or concerns PDMP reviewed ad meds refilled today Pt denies CP, rapid HR, dry mouth, weight loss, difficulty sleeping, agitation/mood disturbances. Reviewed VS today with stimulant meds in adult: BP Readings from Last 3 Encounters:  07/06/23 116/74  12/06/22 118/76  11/16/22 116/77   Pulse Readings from Last 3 Encounters:  07/06/23 87  12/06/22 95  11/16/22 (!) 112   PDMP:  Controlled Substance Database was today, reviewed and no suspicious findings.    Anxiety/depression/mood good and sx well controlled with current meds, she wants to keep doses the same Lexapro 20 and buspar 15 mg sometimes once a day     07/06/2023    8:48 AM 03/07/2023    9:31 AM 12/06/2022    3:27 PM  Depression screen PHQ 2/9  Decreased Interest 0 0 0  Down, Depressed, Hopeless 0 0 0  PHQ - 2 Score 0 0 0  Altered sleeping 0 0 0  Tired, decreased energy 0 0 0   Change in appetite 0 0 0  Feeling bad or failure about yourself  0 0 0  Trouble concentrating 0 0 0  Moving slowly or fidgety/restless 0 0 0  Suicidal thoughts 0 0 0  PHQ-9 Score 0 0 0  Difficult doing work/chores Not difficult at all Not difficult at all Not difficult at all   Hx of prediabetes Lab Results  Component Value Date   HGBA1C 5.9 06/15/2022  HLD Lab Results  Component Value Date   CHOL 222 (A) 06/15/2022   HDL 45 06/15/2022   LDLCALC 143 06/15/2022   TRIG 190 (A) 06/15/2022   CHOLHDL 4.4 02/10/2021   She is not on meds, she will set up CPE and do labs to recheck conditions above in light of weight changes    Current Outpatient Medications:    acetaminophen (TYLENOL) 325 MG tablet, Take 650 mg by mouth as needed., Disp: , Rfl:    busPIRone (BUSPAR) 15 MG tablet, Take 0.5-1 tablets (7.5-15 mg total) by mouth 2 (two) times daily as needed., Disp: 180 tablet, Rfl: 3   Cholecalciferol (VITAMIN D3) 50 MCG (2000 UT) capsule, Take 1 capsule (2,000 Units total) by mouth daily., Disp: , Rfl:    escitalopram (LEXAPRO) 20 MG tablet, Take 1 tablet (20 mg total) by  mouth daily., Disp: 90 tablet, Rfl: 3   fluticasone (FLONASE) 50 MCG/ACT nasal spray, SHAKE LIQUID AND USE 2 SPRAYS IN EACH NOSTRIL DAILY, Disp: 16 g, Rfl: 5   ibuprofen (ADVIL,MOTRIN) 200 MG tablet, Take 400 mg by mouth as needed., Disp: , Rfl:    levocetirizine (XYZAL) 5 MG tablet, TAKE 1 TABLET(5 MG) BY MOUTH EVERY EVENING, Disp: 90 tablet, Rfl: 3   levonorgestrel (MIRENA) 20 MCG/24HR IUD, 1 Intra Uterine Device by Intrauterine route continuous., Disp: , Rfl:    lisdexamfetamine (VYVANSE) 30 MG capsule, Take 1 capsule (30 mg total) by mouth daily., Disp: 30 capsule, Rfl: 0   lisdexamfetamine (VYVANSE) 30 MG capsule, Take 1 capsule (30 mg total) by mouth daily., Disp: 30 capsule, Rfl: 0   lisdexamfetamine (VYVANSE) 30 MG capsule, Take 1 capsule (30 mg total) by mouth daily., Disp: 30 capsule, Rfl: 0   WEGOVY 1  MG/0.5ML SOAJ, Inject 1 mg into the skin once a week., Disp: , Rfl:   Patient Active Problem List   Diagnosis Date Noted   Pelvic pain 08/30/2022   Dermatitis 08/30/2022   Dysuria 08/30/2022   Pain in joint, forearm 08/30/2022   Screening for malignant neoplasm of cervix 08/30/2022   Sore throat 08/30/2022   Prediabetes 07/27/2022   Insomnia 01/27/2022   Allergic rhinitis 01/27/2022   Gastroesophageal reflux disease 01/27/2022   Attention deficit disorder 01/27/2022   ASCUS with positive high risk HPV cervical 08/07/2021   Class 1 obesity with serious comorbidity and body mass index (BMI) of 33.0 to 33.9 in adult 04/09/2021   Vitamin D deficiency 04/09/2021   Anxiety disorder 01/16/2018   Current moderate episode of major depressive disorder without prior episode (HCC) 01/16/2018   Family history of diabetes mellitus 01/16/2018    Past Surgical History:  Procedure Laterality Date   APPENDECTOMY  2009   BREAST REDUCTION SURGERY Bilateral 01/16/2020   Procedure: MAMMARY REDUCTION  (BREAST);  Surgeon: Peggye Form, DO;  Location: [facility];  Service: Plastics;  Laterality: Bilateral;  3.5 hours   CESAREAN SECTION     x2   REDUCTION MAMMAPLASTY Bilateral 12/2019   WISDOM TOOTH EXTRACTION  07/01/2022    Family History  Problem Relation Age of Onset   Diabetes Mother    Cancer Mother 58       lymphoma   Diabetes Father    Pulmonary fibrosis Father    Diabetes Sister    Diabetes Brother    Dementia Maternal Grandmother    Alzheimer's disease Maternal Grandfather    Breast cancer Neg Hx     Social History   Tobacco Use   Smoking status: Never   Smokeless tobacco: Never  Vaping Use   Vaping status: Never Used  Substance Use Topics   Alcohol use: Not Currently   Drug use: No     No Known Allergies  Health Maintenance  Topic Date Due   COVID-19 Vaccine (3 - 2023-24 season) 07/22/2023 (Originally 06/26/2023)   INFLUENZA VACCINE  01/23/2024  (Originally 05/26/2023)   MAMMOGRAM  09/11/2023   PAP SMEAR-Modifier  10/14/2025   DTaP/Tdap/Td (2 - Td or Tdap) 01/17/2029   Hepatitis C Screening  Completed   HIV Screening  Completed   HPV VACCINES  Aged Out    Chart Review Today: I personally reviewed active problem list, medication list, allergies, family history, social history, health maintenance, notes from last encounter, lab results, imaging with the patient/caregiver today.   Review of Systems  Constitutional: Negative.  HENT: Negative.    Eyes: Negative.   Respiratory: Negative.    Cardiovascular: Negative.   Gastrointestinal: Negative.   Endocrine: Negative.   Genitourinary: Negative.   Musculoskeletal: Negative.   Skin: Negative.   Allergic/Immunologic: Negative.   Neurological: Negative.   Hematological: Negative.   Psychiatric/Behavioral: Negative.    All other systems reviewed and are negative.    Objective:   Vitals:   07/06/23 0849  BP: 116/74  Pulse: 87  Resp: 16  Temp: (!) 97.5 F (36.4 C)  TempSrc: Oral  SpO2: 100%  Weight: 171 lb 4.8 oz (77.7 kg)  Height: 5' 3.5" (1.613 m)    Body mass index is 29.87 kg/m.  Physical Exam Vitals and nursing note reviewed.  Constitutional:      General: She is not in acute distress.    Appearance: Normal appearance. She is well-developed and normal weight. She is not ill-appearing, toxic-appearing or diaphoretic.  HENT:     Head: Normocephalic and atraumatic.     Right Ear: External ear normal.     Left Ear: External ear normal.     Nose: Nose normal.     Mouth/Throat:     Pharynx: No oropharyngeal exudate.  Eyes:     General: No scleral icterus.       Right eye: No discharge.        Left eye: No discharge.     Conjunctiva/sclera: Conjunctivae normal.  Neck:     Trachea: No tracheal deviation.  Cardiovascular:     Rate and Rhythm: Normal rate and regular rhythm.     Heart sounds: Normal heart sounds. No murmur heard.    No friction rub. No  gallop.  Pulmonary:     Effort: Pulmonary effort is normal. No respiratory distress.     Breath sounds: Normal breath sounds. No stridor. No wheezing or rales.  Chest:     Chest wall: No tenderness.  Skin:    General: Skin is warm and dry.     Coloration: Skin is not pale.     Findings: Rash present.  Neurological:     Mental Status: She is alert. Mental status is at baseline.     Motor: No abnormal muscle tone.     Coordination: Coordination normal.  Psychiatric:        Mood and Affect: Mood normal.        Behavior: Behavior normal.         Assessment & Plan:   Problem List Items Addressed This Visit       Other   Anxiety disorder    Sx stable and well controlled on lexapro 20 and buspar 15 mg TID prn - taking usually once a day    07/06/2023    8:48 AM 03/07/2023    9:31 AM 12/06/2022    3:27 PM 08/30/2022    8:42 AM  GAD 7 : Generalized Anxiety Score  Nervous, Anxious, on Edge 0 0 0 0  Control/stop worrying 0 0 0 0  Worry too much - different things 0 0 0 0  Trouble relaxing 0 0 0 0  Restless 0 0 0 0  Easily annoyed or irritable 0 0 0 0  Afraid - awful might happen 0 0 0 0  Total GAD 7 Score 0 0 0 0  Anxiety Difficulty Not difficult at all Not difficult at all Not difficult at all           MDD (recurrent major depressive disorder) in remission (HCC)  Sx well controlled, PHQ 9 reviewed and negative Continue lexapro 20    07/06/2023    8:48 AM 03/07/2023    9:31 AM 12/06/2022    3:27 PM  Depression screen PHQ 2/9  Decreased Interest 0 0 0  Down, Depressed, Hopeless 0 0 0  PHQ - 2 Score 0 0 0  Altered sleeping 0 0 0  Tired, decreased energy 0 0 0  Change in appetite 0 0 0  Feeling bad or failure about yourself  0 0 0  Trouble concentrating 0 0 0  Moving slowly or fidgety/restless 0 0 0  Suicidal thoughts 0 0 0  PHQ-9 Score 0 0 0  Difficult doing work/chores Not difficult at all Not difficult at all Not difficult at all         Attention deficit  disorder - Primary    Sx stable and well controlled with vyvanse generic 30 mg Reviewed database - no concerns No concerning SE with meds Good compliance VS reviewed today Can do virtual 3 month f/up for next refills if needed      Relevant Medications   lisdexamfetamine (VYVANSE) 30 MG capsule   lisdexamfetamine (VYVANSE) 30 MG capsule (Start on 08/05/2023)   lisdexamfetamine (VYVANSE) 30 MG capsule (Start on 09/04/2023)   Prediabetes    With diet/lifestyle changes and weight loss we will recheck labs at next appt CPE Lab Results  Component Value Date   HGBA1C 5.9 06/15/2022         Overweight    She's lost weight with doing weight watchers, working out and did a program and work/insurance where she get [employer] Readings from Last 5 Encounters:  07/06/23 171 lb 4.8 oz (77.7 kg)  12/06/22 190 lb 9.6 oz (86.5 kg)  11/16/22 195 lb (88.5 kg)  11/10/22 185 lb (83.9 kg)  10/14/22 192 lb 11.2 oz (87.4 kg)   BMI Readings from Last 5 Encounters:  07/06/23 29.87 kg/m  12/06/22 33.23 kg/m  11/16/22 34.00 kg/m  11/10/22 32.77 kg/m  10/14/22 33.60 kg/m   BMI now in overweight category, out of obese range, and she looks great. She has lost weight very slowly and gradually she states and all with a program      Hyperlipidemia    Lab Results  Component Value Date   CHOL 222 (A) 06/15/2022   HDL 45 06/15/2022   LDLCALC 143 06/15/2022   TRIG 190 (A) 06/15/2022   CHOLHDL 4.4 02/10/2021   Will recheck at next OV/CPE      Other Visit Diagnoses     Rash and nonspecific skin eruption       pt request refill on prior steroid ointment for return of patches to anterior legs bilaterally   Relevant Medications   clobetasol ointment (TEMOVATE) 0.05 %        Return in about 3 months (around 10/05/2023) for Annual Physical (3 month ADHD med refill virtual ok if needed instead of CPE).   Danelle Berry, PA-C 07/06/23 8:59 AM

## 2023-10-06 ENCOUNTER — Ambulatory Visit (INDEPENDENT_AMBULATORY_CARE_PROVIDER_SITE_OTHER): Payer: BC Managed Care – PPO | Admitting: Physician Assistant

## 2023-10-06 VITALS — BP 122/76 | HR 91 | Resp 16 | Ht 63.0 in | Wt 153.0 lb

## 2023-10-06 DIAGNOSIS — Z7189 Other specified counseling: Secondary | ICD-10-CM | POA: Insufficient documentation

## 2023-10-06 DIAGNOSIS — Z1329 Encounter for screening for other suspected endocrine disorder: Secondary | ICD-10-CM

## 2023-10-06 DIAGNOSIS — F3340 Major depressive disorder, recurrent, in remission, unspecified: Secondary | ICD-10-CM

## 2023-10-06 DIAGNOSIS — E663 Overweight: Secondary | ICD-10-CM

## 2023-10-06 DIAGNOSIS — Z136 Encounter for screening for cardiovascular disorders: Secondary | ICD-10-CM

## 2023-10-06 DIAGNOSIS — Z131 Encounter for screening for diabetes mellitus: Secondary | ICD-10-CM

## 2023-10-06 DIAGNOSIS — R7303 Prediabetes: Secondary | ICD-10-CM

## 2023-10-06 DIAGNOSIS — Z0000 Encounter for general adult medical examination without abnormal findings: Secondary | ICD-10-CM

## 2023-10-06 DIAGNOSIS — Z1231 Encounter for screening mammogram for malignant neoplasm of breast: Secondary | ICD-10-CM

## 2023-10-06 NOTE — Assessment & Plan Note (Signed)
A voluntary discussion about advance care planning including the explanation and discussion of advance directives was extensively discussed  with the patient for 3  minutes with patient and myself  present.  Explanation about the health care proxy and Living will was reviewed and packet with forms with explanation of how to fill them out was given.  During this discussion, the patient was able to identify a health care proxy as  Sharon Seller and plans  to fill out the paperwork required.  Patient was offered a separate Advance Care Planning visit for further assistance with forms.

## 2023-10-06 NOTE — Progress Notes (Signed)
Annual Physical Exam   Name: Laura Watson   MRN: [id number]    DOB: 25-May-1981   Date:10/06/2023  Today's Provider: Jacquelin Hawking, MHS, PA-C Introduced myself to the patient as a PA-C and provided education on APPs in clinical practice.         Subjective  Chief Complaint  Chief Complaint  Patient presents with   Annual Exam    HPI  Patient presents for annual CPE.  Diet: Well balanced with protein, carbs, fats and vegetables  Exercise: She is exercising a few times per week- trying to walk   Sleep: "not great" getting about 5-6 hours per night, feels tired the next day. Reports sleep maintenance issues are predominant. Has tried Trazodone in the past but did not like side effects and it was not very effective  Mood: "I'm alright"   Flowsheet Row Office Visit from 10/06/2023 in [facility]  AUDIT-C Score 1       Depression: Phq 9 is  negative    10/06/2023    8:47 AM 07/06/2023    8:48 AM 03/07/2023    9:31 AM 12/06/2022    3:27 PM 08/30/2022    8:41 AM  Depression screen PHQ 2/9  Decreased Interest 0 0 0 0 0  Down, Depressed, Hopeless 0 0 0 0 0  PHQ - 2 Score 0 0 0 0 0  Altered sleeping 1 0 0 0 0  Tired, decreased energy 1 0 0 0 0  Change in appetite 0 0 0 0 0  Feeling bad or failure about yourself  0 0 0 0 0  Trouble concentrating 0 0 0 0 0  Moving slowly or fidgety/restless 0 0 0 0 0  Suicidal thoughts 0 0 0 0 0  PHQ-9 Score 2 0 0 0 0  Difficult doing work/chores  Not difficult at all Not difficult at all Not difficult at all    Hypertension: BP Readings from Last 3 Encounters:  10/06/23 122/76  07/06/23 116/74  12/06/22 118/76   Obesity: Wt Readings from Last 3 Encounters:  10/06/23 153 lb (69.4 kg)  07/06/23 171 lb 4.8 oz (77.7 kg)  12/06/22 190 lb 9.6 oz (86.5 kg)   BMI Readings from Last 3 Encounters:  10/06/23 27.10 kg/m  07/06/23 29.87 kg/m  12/06/22 33.23 kg/m     Health Maintenance  Topic Date  Due   COVID-19 Vaccine (3 - 2024-25 season) 06/26/2023   Mammogram  09/11/2023   Flu Shot  01/23/2024*   Pap with HPV screening  10/15/2027   DTaP/Tdap/Td vaccine (2 - Td or Tdap) 01/17/2029   Hepatitis C Screening  Completed   HIV Screening  Completed   HPV Vaccine  Aged Out  *Topic was postponed. The date shown is not the original due date.     STD testing and prevention (HIV/chl/gon/syphilis): declines today- will get g/c potentially at PAP  Intimate partner violence: negative Sexual History: she is sexually active with single monogamous female partner  Menstrual History/LMP/Abnormal Bleeding: she has IUD so her periods are not regular  Discussed importance of follow up if any post-menopausal bleeding: not applicable Incontinence Symptoms: No.  Breast cancer hx:  - Last Mammogram: ordered today  - BRCA gene screening:   Osteoporosis Prevention : Discussed high calcium and vitamin D supplementation, weight bearing exercises Bone density :not applicable  Cervical cancer screening: completes with OB/GYN services   Skin cancer: Discussed monitoring for atypical lesions  Colorectal cancer screening: no known family hx, routing screening at age 84    Lung cancer:  Low Dose CT Chest recommended if Age 50-80 years, 20 pack-year currently smoking OR have quit w/in 15years. Patient does not qualify.   ECG: NA  Advanced Care Planning: A voluntary discussion about advance care planning including the explanation and discussion of advance directives.  Discussed health care proxy and Living will, and the patient was able to identify a health care proxy as her husband, Sharon Seller .  Patient does not have a living will in effect.  Lipids: Lab Results  Component Value Date   CHOL 222 (A) 06/15/2022   CHOL 188 02/10/2021   CHOL 188 04/07/2020   Lab Results  Component Value Date   HDL 45 06/15/2022   HDL 43 (L) 02/10/2021   HDL 41 04/07/2020   Lab Results  Component Value Date    LDLCALC 143 06/15/2022   LDLCALC 116 (H) 02/10/2021   LDLCALC 126 (H) 04/07/2020   Lab Results  Component Value Date   TRIG 190 (A) 06/15/2022   TRIG 175 (H) 02/10/2021   TRIG 118 04/07/2020   Lab Results  Component Value Date   CHOLHDL 4.4 02/10/2021   CHOLHDL 4.6 (H) 04/07/2020   CHOLHDL 3.5 01/16/2018   No results found for: "LDLDIRECT"  Glucose: Glucose  Date Value Ref Range Status  08/06/2022 81 70 - 99 mg/dL Final  47/82/9562 97 65 - 99 mg/dL Final  13/05/6577 75 65 - 99 mg/dL Final   Glucose, Bld  Date Value Ref Range Status  02/10/2021 74 65 - 99 mg/dL Final    Comment:    .            Fasting reference interval .   07/07/2017 89 65 - 99 mg/dL Final    Patient Active Problem List   Diagnosis Date Noted   Advanced care planning/counseling discussion 10/06/2023   Overweight 07/06/2023   Hyperlipidemia 07/06/2023   Pelvic pain 08/30/2022   Dermatitis 08/30/2022   Dysuria 08/30/2022   Pain in joint, forearm 08/30/2022   Screening for malignant neoplasm of cervix 08/30/2022   Sore throat 08/30/2022   Prediabetes 07/27/2022   Insomnia 01/27/2022   Allergic rhinitis 01/27/2022   Gastroesophageal reflux disease 01/27/2022   Attention deficit disorder 01/27/2022   ASCUS with positive high risk HPV cervical 08/07/2021   Class 1 obesity with serious comorbidity and body mass index (BMI) of 33.0 to 33.9 in adult 04/09/2021   Vitamin D deficiency 04/09/2021   Anxiety disorder 01/16/2018   MDD (recurrent major depressive disorder) in remission (HCC) 01/16/2018   Family history of diabetes mellitus 01/16/2018    Past Surgical History:  Procedure Laterality Date   APPENDECTOMY  2009   BREAST REDUCTION SURGERY Bilateral 01/16/2020   Procedure: MAMMARY REDUCTION  (BREAST);  Surgeon: Peggye Form, DO;  Location: [facility];  Service: Plastics;  Laterality: Bilateral;  3.5 hours   CESAREAN SECTION     x2   REDUCTION MAMMAPLASTY Bilateral  12/2019   WISDOM TOOTH EXTRACTION  07/01/2022    Family History  Problem Relation Age of Onset   Diabetes Mother    Cancer Mother 81       lymphoma   Diabetes Father    Pulmonary fibrosis Father    Diabetes Sister    Diabetes Brother    Dementia Maternal Grandmother    Alzheimer's disease Maternal Grandfather    Breast cancer Neg Hx  Social History   Socioeconomic History   Marital status: Married    Spouse name: Not on file   Number of children: 2   Years of education: Not on file   Highest education level: 12th grade  Occupational History   Not on file  Tobacco Use   Smoking status: Never   Smokeless tobacco: Never  Vaping Use   Vaping status: Never Used  Substance and Sexual Activity   Alcohol use: Not Currently   Drug use: No   Sexual activity: Yes    Partners: Male    Birth control/protection: I.U.D.    Comment: Mirena  Other Topics Concern   Not on file  Social History Narrative   Works at [employer] in Web [employer].   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   Getting married Nov   Social Drivers of [employer]: Low Risk  (10/06/2023)   Overall Financial Resource Strain (CARDIA)    Difficulty of Paying Living Expenses: Not very hard  Food Insecurity: No Food Insecurity (10/06/2023)   Hunger Vital Sign    Worried About Running Out of Food in the Last Year: Never true    Ran Out of Food in the Last Year: Never true  Transportation Needs: No Transportation Needs (10/06/2023)   PRAPARE - Administrator, Civil Service (Medical): No    Lack of Transportation (Non-Medical): No  Physical Activity: Insufficiently Active (10/06/2023)   Exercise Vital Sign    Days of Exercise per Week: 3 days    Minutes of Exercise per Session: 20 min  Stress: Stress Concern Present (10/06/2023)   [employer] of Occupational Health - Occupational Stress Questionnaire     Feeling of Stress : To some extent  Social Connections: Moderately Isolated (10/06/2023)   Social Connection and Isolation Panel [NHANES]    Frequency of Communication with Friends and Family: Three times a week    Frequency of Social Gatherings with Friends and Family: Never    Attends Religious Services: Never    Database administrator or Organizations: No    Attends Engineer, structural: Not on file    Marital Status: Married  Catering manager Violence: Not At Risk (10/06/2023)   Humiliation, Afraid, Rape, and Kick questionnaire    Fear of Current or Ex-Partner: No    Emotionally Abused: No    Physically Abused: No    Sexually Abused: No     Current Outpatient Medications:    acetaminophen (TYLENOL) 325 MG tablet, Take 650 mg by mouth as needed., Disp: , Rfl:    busPIRone (BUSPAR) 15 MG tablet, Take 0.5-1 tablets (7.5-15 mg total) by mouth 2 (two) times daily as needed., Disp: 180 tablet, Rfl: 3   Cholecalciferol (VITAMIN D3) 50 MCG (2000 UT) capsule, Take 1 capsule (2,000 Units total) by mouth daily., Disp: , Rfl:    clobetasol ointment (TEMOVATE) 0.05 %, Apply 1 Application topically 2 (two) times daily., Disp: 60 g, Rfl: 1   escitalopram (LEXAPRO) 20 MG tablet, Take 1 tablet (20 mg total) by mouth daily., Disp: 90 tablet, Rfl: 3   fluticasone (FLONASE) 50 MCG/ACT nasal spray, SHAKE LIQUID AND USE 2 SPRAYS IN EACH NOSTRIL DAILY, Disp: 16 g, Rfl: 5   ibuprofen (ADVIL,MOTRIN) 200 MG tablet, Take 400 mg by mouth as needed., Disp: , Rfl:    levonorgestrel (MIRENA) 20 MCG/24HR IUD, 1 Intra Uterine Device by Intrauterine route continuous., Disp: ,  Rfl:    lisdexamfetamine (VYVANSE) 30 MG capsule, Take 1 capsule (30 mg total) by mouth daily., Disp: 30 capsule, Rfl: 0   lisdexamfetamine (VYVANSE) 30 MG capsule, Take 1 capsule (30 mg total) by mouth daily., Disp: 30 capsule, Rfl: 0   lisdexamfetamine (VYVANSE) 30 MG capsule, Take 1 capsule (30 mg total) by mouth daily., Disp: 30  capsule, Rfl: 0   WEGOVY 1 MG/0.5ML SOAJ, Inject 1 mg into the skin once a week., Disp: , Rfl:   No Known Allergies   Review of Systems  Constitutional:  Negative for chills, fever, malaise/fatigue and weight loss.  HENT:  Negative for hearing loss, nosebleeds, sore throat and tinnitus.   Eyes:  Negative for blurred vision, double vision and photophobia.  Respiratory:  Negative for cough, shortness of breath and wheezing.   Cardiovascular:  Negative for chest pain, palpitations and leg swelling.  Gastrointestinal:  Positive for heartburn and nausea. Negative for blood in stool, constipation, diarrhea and vomiting.  Genitourinary:  Negative for dysuria and frequency.  Musculoskeletal:  Negative for falls, joint pain and myalgias.  Skin:  Negative for itching and rash.  Neurological:  Negative for dizziness, tingling, tremors, loss of consciousness, weakness and headaches.  Psychiatric/Behavioral:  Negative for depression, memory loss and suicidal ideas. The patient has insomnia. The patient is not nervous/anxious.       Objective  Vitals:   10/06/23 0844  BP: 122/76  Pulse: 91  Resp: 16  SpO2: 100%  Weight: 153 lb (69.4 kg)  Height: 5\' 3"  (1.6 m)    Body mass index is 27.1 kg/m.  Physical Exam Vitals reviewed.  Constitutional:      General: She is awake.     Appearance: Normal appearance. She is well-developed and well-groomed.  HENT:     Head: Normocephalic and atraumatic.     Right Ear: Hearing, tympanic membrane, ear canal and external ear normal.     Left Ear: Hearing, tympanic membrane, ear canal and external ear normal.     Nose: Nose normal.     Mouth/Throat:     Lips: Pink.     Mouth: Mucous membranes are moist.     Pharynx: Oropharynx is clear. No oropharyngeal exudate or posterior oropharyngeal erythema.  Eyes:     General: Lids are normal. Gaze aligned appropriately.     Extraocular Movements: Extraocular movements intact.     Conjunctiva/sclera:  Conjunctivae normal.     Pupils: Pupils are equal, round, and reactive to light.  Neck:     Thyroid: No thyroid mass, thyromegaly or thyroid tenderness.  Cardiovascular:     Rate and Rhythm: Normal rate and regular rhythm.     Pulses: Normal pulses.          Radial pulses are 2+ on the right side and 2+ on the left side.     Heart sounds: Normal heart sounds. No murmur heard.    No friction rub. No gallop.  Pulmonary:     Effort: Pulmonary effort is normal.     Breath sounds: Normal breath sounds. No decreased air movement. No decreased breath sounds, wheezing, rhonchi or rales.  Abdominal:     General: Abdomen is flat. Bowel sounds are normal.     Palpations: Abdomen is soft.     Tenderness: There is no abdominal tenderness.  Musculoskeletal:        General: Normal range of motion.     Cervical back: Normal range of motion and neck supple. No pain with  movement.     Right lower leg: No edema.     Left lower leg: No edema.  Lymphadenopathy:     Head:     Right side of head: No submental, submandibular or preauricular adenopathy.     Left side of head: No submental, submandibular or preauricular adenopathy.     Cervical:     Right cervical: No superficial or posterior cervical adenopathy.    Left cervical: No superficial or posterior cervical adenopathy.     Upper Body:     Right upper body: No supraclavicular adenopathy.     Left upper body: No supraclavicular adenopathy.  Skin:    General: Skin is warm and dry.     Capillary Refill: Capillary refill takes less than 2 seconds.  Neurological:     General: No focal deficit present.     Mental Status: She is alert and oriented to person, place, and time.     GCS: GCS eye subscore is 4. GCS verbal subscore is 5. GCS motor subscore is 6.     Cranial Nerves: No cranial nerve deficit, dysarthria or facial asymmetry.     Motor: No weakness, tremor, atrophy or abnormal muscle tone.     Gait: Gait is intact.     Deep Tendon Reflexes:      Reflex Scores:      Patellar reflexes are 2+ on the right side and 2+ on the left side. Psychiatric:        Attention and Perception: Attention and perception normal.        Mood and Affect: Mood and affect normal.        Speech: Speech normal.        Behavior: Behavior normal. Behavior is cooperative.        Thought Content: Thought content normal.        Cognition and Memory: Cognition and memory normal.        Judgment: Judgment normal.      No results found for this or any previous visit (from the past 2160 hours).   Fall Risk:    10/06/2023    8:43 AM 07/06/2023    8:48 AM 03/07/2023    9:30 AM 12/06/2022    3:22 PM 08/30/2022    8:41 AM  Fall Risk   Falls in the past year? 0 0 0 0 0  Number falls in past yr: 0 0 0 0   Injury with Fall? 0 0 0 0   Risk for fall due to : No Fall Risks No Fall Risks No Fall Risks No Fall Risks No Fall Risks  Follow up Falls prevention discussed Falls prevention discussed;Education provided;Falls evaluation completed Falls prevention discussed;Education provided;Falls evaluation completed Falls prevention discussed;Education provided;Falls evaluation completed Falls prevention discussed     Functional Status Survey:     Assessment & Plan  Problem List Items Addressed This Visit       Other   Advanced care planning/counseling discussion   A voluntary discussion about advance care planning including the explanation and discussion of advance directives was extensively discussed  with the patient for 3  minutes with patient and myself  present.  Explanation about the health care proxy and Living will was reviewed and packet with forms with explanation of how to fill them out was given.  During this discussion, the patient was able to identify a health care proxy as  Sharon Seller and plans  to fill out the paperwork required.  Patient was offered  a separate Advance Care Planning visit for further assistance with forms.         Other Visit  Diagnoses       Well adult exam    -  Primary   Relevant Orders   CBC with Differential/Platelet   Hemoglobin A1c   Lipid panel   TSH   Comprehensive metabolic panel     Breast cancer screening by mammogram       Relevant Orders   MM 3D SCREENING MAMMOGRAM BILATERAL BREAST     Screening for diabetes mellitus (DM)       Relevant Orders   Hemoglobin A1c     Screening for thyroid disorder       Relevant Orders   TSH     Screening for ischemic heart disease       Relevant Orders   Lipid panel      -USPSTF grade A and B recommendations reviewed with patient; age-appropriate recommendations, preventive care, screening tests, etc discussed and encouraged; healthy living encouraged; see AVS for patient education given to patient -Discussed importance of 150 minutes of physical activity weekly, eat two servings of fish weekly, eat one serving of tree nuts ( cashews, pistachios, pecans, almonds.Marland Kitchen) every other day, eat 6 servings of fruit/vegetables daily and drink plenty of water and avoid sweet beverages.   -Reviewed Health Maintenance: Yes.     No follow-ups on file.   I, Jahad Old E Jaslen Adcox, PA-C, have reviewed all documentation for this visit. The documentation on 10/06/23 for the exam, diagnosis, procedures, and orders are all accurate and complete.   Jacquelin Hawking, MHS, PA-C [facility]

## 2023-10-07 DIAGNOSIS — Z136 Encounter for screening for cardiovascular disorders: Secondary | ICD-10-CM | POA: Diagnosis not present

## 2023-10-07 DIAGNOSIS — Z0000 Encounter for general adult medical examination without abnormal findings: Secondary | ICD-10-CM | POA: Diagnosis not present

## 2023-10-07 DIAGNOSIS — Z131 Encounter for screening for diabetes mellitus: Secondary | ICD-10-CM | POA: Diagnosis not present

## 2023-10-07 DIAGNOSIS — Z1329 Encounter for screening for other suspected endocrine disorder: Secondary | ICD-10-CM | POA: Diagnosis not present

## 2023-10-08 LAB — COMPREHENSIVE METABOLIC PANEL
ALT: 11 [IU]/L (ref 0–32)
AST: 15 [IU]/L (ref 0–40)
Albumin: 4.5 g/dL (ref 3.9–4.9)
Alkaline Phosphatase: 72 [IU]/L (ref 44–121)
BUN/Creatinine Ratio: 9 (ref 9–23)
BUN: 7 mg/dL (ref 6–24)
Bilirubin Total: 0.7 mg/dL (ref 0.0–1.2)
CO2: 20 mmol/L (ref 20–29)
Calcium: 9.4 mg/dL (ref 8.7–10.2)
Chloride: 105 mmol/L (ref 96–106)
Creatinine, Ser: 0.75 mg/dL (ref 0.57–1.00)
Globulin, Total: 2.3 g/dL (ref 1.5–4.5)
Glucose: 79 mg/dL (ref 70–99)
Potassium: 4.1 mmol/L (ref 3.5–5.2)
Sodium: 140 mmol/L (ref 134–144)
Total Protein: 6.8 g/dL (ref 6.0–8.5)
eGFR: 101 mL/min/{1.73_m2} (ref >59)

## 2023-10-08 LAB — CBC WITH DIFFERENTIAL/PLATELET
Basophils Absolute: 0.1 10*3/uL (ref 0.0–0.2)
Basos: 1 %
EOS (ABSOLUTE): 0.2 10*3/uL (ref 0.0–0.4)
Eos: 2 %
Hematocrit: 39 % (ref 34.0–46.6)
Hemoglobin: 13.1 g/dL (ref 11.1–15.9)
Immature Grans (Abs): 0 10*3/uL (ref 0.0–0.1)
Immature Granulocytes: 0 %
Lymphocytes Absolute: 2.4 10*3/uL (ref 0.7–3.1)
Lymphs: 29 %
MCH: 30.4 pg (ref 26.6–33.0)
MCHC: 33.6 g/dL (ref 31.5–35.7)
MCV: 91 fL (ref 79–97)
Monocytes Absolute: 0.5 10*3/uL (ref 0.1–0.9)
Monocytes: 6 %
Neutrophils Absolute: 5.1 10*3/uL (ref 1.4–7.0)
Neutrophils: 62 %
Platelets: 406 10*3/uL (ref 150–450)
RBC: 4.31 x10E6/uL (ref 3.77–5.28)
RDW: 12.8 % (ref 11.7–15.4)
WBC: 8.2 10*3/uL (ref 3.4–10.8)

## 2023-10-08 LAB — LIPID PANEL
Chol/HDL Ratio: 3.5 {ratio} (ref 0.0–4.4)
Cholesterol, Total: 149 mg/dL (ref 100–199)
HDL: 43 mg/dL (ref >39)
LDL Chol Calc (NIH): 94 mg/dL (ref 0–99)
Triglycerides: 57 mg/dL (ref 0–149)
VLDL Cholesterol Cal: 12 mg/dL (ref 5–40)

## 2023-10-08 LAB — TSH: TSH: 1.38 u[IU]/mL (ref 0.450–4.500)

## 2023-10-08 LAB — HEMOGLOBIN A1C
Est. average glucose Bld gHb Est-mCnc: 100 mg/dL
Hgb A1c MFr Bld: 5.1 % (ref 4.8–5.6)

## 2023-10-10 NOTE — Progress Notes (Signed)
Labs are normal/stable.

## 2023-10-17 ENCOUNTER — Encounter: Payer: Self-pay | Admitting: Licensed Practical Nurse

## 2023-10-17 ENCOUNTER — Ambulatory Visit (INDEPENDENT_AMBULATORY_CARE_PROVIDER_SITE_OTHER): Payer: BC Managed Care – PPO | Admitting: Licensed Practical Nurse

## 2023-10-17 VITALS — BP 125/80 | HR 101 | Ht 63.0 in | Wt 151.9 lb

## 2023-10-17 DIAGNOSIS — Z01419 Encounter for gynecological examination (general) (routine) without abnormal findings: Secondary | ICD-10-CM | POA: Diagnosis not present

## 2023-10-17 DIAGNOSIS — Z124 Encounter for screening for malignant neoplasm of cervix: Secondary | ICD-10-CM

## 2023-10-17 NOTE — Progress Notes (Signed)
Gynecology Annual Exam  PCP: Danelle Berry, PA-C  Chief Complaint: No chief complaint on file.  History of Present Illness: Patient is a 42 y.o. [id number] presents for annual exam. The patient has no complaints today.   Started [place] about 5 months ago, has lost 50lbs    LMP: No LMP recorded. (Menstrual status: IUD). Gets PMS like symptoms when she would have a cycle     The patient is sexually active. She currently uses IUD for contraception. She denies dyspareunia.  The patient does not perform self breast exams.  There is no notable family history of breast or ovarian cancer in her family. Hx of breast reduction.   The patient wears seatbelts: yes.   The patient has regular exercise:  walks her dog    The patient denies current symptoms of depression.  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, feels safe PCP seen Early December Wears glasses, last eye exam August Dental up to date  Derm; needs   Review of Systems: ROS Report hot flashes/night sweats reported at last visit have improved, they keep tier house cooler which helps, they are not bothersome to her    Past Medical History:  Patient Active Problem List   Diagnosis Date Noted Date Diagnosed   Advanced care planning/counseling discussion 10/06/2023    Overweight 07/06/2023    Hyperlipidemia 07/06/2023    Pelvic pain 08/30/2022    Dermatitis 08/30/2022    Dysuria 08/30/2022    Pain in joint, forearm 08/30/2022    Screening for malignant neoplasm of cervix 08/30/2022    Sore throat 08/30/2022     Rapid strep negative. Pt informed she will be notified in case of pos throat culture. Given home care instructions per [facility]: Sore throat. Instructed to call clinic for fever, increasing pain, increased difficulty swallowing or concerns. V/U    Prediabetes 07/27/2022    Insomnia 01/27/2022    Allergic rhinitis 01/27/2022    Gastroesophageal reflux disease 01/27/2022     Attention deficit disorder 01/27/2022    ASCUS with positive high risk HPV cervical 08/07/2021    Class 1 obesity with serious comorbidity and body mass index (BMI) of 33.0 to 33.9 in adult 04/09/2021    Vitamin D deficiency 04/09/2021    Anxiety disorder 01/16/2018    MDD (recurrent major depressive disorder) in remission (HCC) 01/16/2018    Family history of diabetes mellitus 01/16/2018     Past Surgical History:  Past Surgical History:  Procedure Laterality Date   APPENDECTOMY  2009   BREAST REDUCTION SURGERY Bilateral 01/16/2020   Procedure: MAMMARY REDUCTION  (BREAST);  Surgeon: Peggye Form, DO;  Location: [facility];  Service: Plastics;  Laterality: Bilateral;  3.5 hours   CESAREAN SECTION     x2   REDUCTION MAMMAPLASTY Bilateral 12/2019   WISDOM TOOTH EXTRACTION  07/01/2022    Gynecologic History:  No LMP recorded. (Menstrual status: IUD). Contraception: IUD Last Pap: Results were: 09/2022 ASCUS with POSITIVE high risk HPV, colpo 2024 CIN1 Last mammogram: 08/2022 Results were: BI-RAD I  Obstetric History: G2P2002  Family History:  Family History  Problem Relation Age of Onset   Diabetes Mother    Cancer Mother 25       lymphoma   Dementia Mother 86   Diabetes Father    Pulmonary fibrosis Father    Diabetes Sister    Diabetes Brother    Dementia Maternal Grandmother    Alzheimer's disease Maternal Grandfather  Breast cancer Neg Hx     Social History:  Social History   Socioeconomic History   Marital status: Married    Spouse name: Not on file   Number of children: 2   Years of education: Not on file   Highest education level: 12th grade  Occupational History   Not on file  Tobacco Use   Smoking status: Never   Smokeless tobacco: Never  Vaping Use   Vaping status: Never Used  Substance and Sexual Activity   Alcohol use: Not Currently   Drug use: No   Sexual activity: Yes    Partners: Male    Birth control/protection:  I.U.D.    Comment: Mirena  Other Topics Concern   Not on file  Social History Narrative   Works at [employer] in Web [employer].   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   Getting married Nov   Social Drivers of [employer]: Low Risk  (10/06/2023)   Overall Financial Resource Strain (CARDIA)    Difficulty of Paying Living Expenses: Not very hard  Food Insecurity: No Food Insecurity (10/06/2023)   Hunger Vital Sign    Worried About Running Out of Food in the Last Year: Never true    Ran Out of Food in the Last Year: Never true  Transportation Needs: No Transportation Needs (10/06/2023)   PRAPARE - Administrator, Civil Service (Medical): No    Lack of Transportation (Non-Medical): No  Physical Activity: Insufficiently Active (10/06/2023)   Exercise Vital Sign    Days of Exercise per Week: 3 days    Minutes of Exercise per Session: 20 min  Stress: Stress Concern Present (10/06/2023)   [employer] of Occupational Health - Occupational Stress Questionnaire    Feeling of Stress : To some extent  Social Connections: Moderately Isolated (10/06/2023)   Social Connection and Isolation Panel [NHANES]    Frequency of Communication with Friends and Family: Three times a week    Frequency of Social Gatherings with Friends and Family: Never    Attends Religious Services: Never    Database administrator or Organizations: No    Attends Engineer, structural: Not on file    Marital Status: Married  Catering manager Violence: Not At Risk (10/06/2023)   Humiliation, Afraid, Rape, and Kick questionnaire    Fear of Current or Ex-Partner: No    Emotionally Abused: No    Physically Abused: No    Sexually Abused: No    Allergies:  No Known Allergies  Medications: Prior to Admission medications   Medication Sig Start Date End Date Taking? Authorizing Provider  acetaminophen (TYLENOL)  325 MG tablet Take 650 mg by mouth as needed.   Yes [provider]  busPIRone (BUSPAR) 15 MG tablet Take 0.5-1 tablets (7.5-15 mg total) by mouth 2 (two) times daily as needed. 12/06/22  Yes Danelle Berry, PA-C  Cholecalciferol (VITAMIN D3) 50 MCG (2000 UT) capsule Take 1 capsule (2,000 Units total) by mouth daily. 07/23/21  Yes Danelle Berry, PA-C  clobetasol ointment (TEMOVATE) 0.05 % Apply 1 Application topically 2 (two) times daily. 07/06/23  Yes Danelle Berry, PA-C  escitalopram (LEXAPRO) 20 MG tablet Take 1 tablet (20 mg total) by mouth daily. 02/07/23  Yes Danelle Berry, PA-C  fluticasone (FLONASE) 50 MCG/ACT nasal spray SHAKE LIQUID AND USE 2 SPRAYS IN EACH NOSTRIL DAILY 07/26/22  Yes Danelle Berry, PA-C  ibuprofen (ADVIL,MOTRIN)  200 MG tablet Take 400 mg by mouth as needed.   Yes [provider]  levonorgestrel (MIRENA) 20 MCG/24HR IUD 1 Intra Uterine Device by Intrauterine route continuous.   Yes [provider]  lisdexamfetamine (VYVANSE) 30 MG capsule Take 1 capsule (30 mg total) by mouth daily. 07/06/23  Yes Danelle Berry, PA-C  lisdexamfetamine (VYVANSE) 30 MG capsule Take 1 capsule (30 mg total) by mouth daily. 08/05/23  Yes Danelle Berry, PA-C  lisdexamfetamine (VYVANSE) 30 MG capsule Take 1 capsule (30 mg total) by mouth daily. 09/04/23  Yes Tapia, Leisa, PA-C  WEGOVY 1 MG/0.5ML SOAJ Inject 1 mg into the skin once a week. 06/18/23  Yes [provider]    Physical Exam Vitals: Blood pressure 125/80, pulse (!) 101, height 5\' 3"  (1.6 m), weight 151 lb 14.4 oz (68.9 kg).  General: NAD HEENT: normocephalic, anicteric Thyroid: no enlargement, no palpable nodules Pulmonary: No increased work of breathing, CTAB Cardiovascular: RRR, distal pulses 2+ Breast: Breast symmetrical, no tenderness, no palpable nodules or masses, no skin or nipple retraction present, no nipple discharge.  No axillary or supraclavicular lymphadenopathy. Abdomen: NABS, soft, non-tender,  non-distended.  Umbilicus without lesions.  No hepatomegaly, splenomegaly or masses palpable. No evidence of hernia  Genitourinary:  External: Normal external female genitalia.  Normal urethral meatus, normal Bartholin's and Skene's glands.    Vagina: Normal vaginal mucosa, no evidence of prolapse.    Cervix: Grossly normal in appearance, no bleeding. IUD strings not visualized has had [place] confirming IUD location   Uterus: Non-enlarged, mobile, normal contour.  No CMT  Adnexa: ovaries non-enlarged, no adnexal masses  Rectal: deferred  Lymphatic: no evidence of inguinal lymphadenopathy Extremities: no edema, erythema, or tenderness Neurologic: Grossly intact Psychiatric: mood appropriate, affect full   Assessment: 42 y.o. G2P2002 routine annual exam  Plan: Problem List Items Addressed This Visit   None Visit Diagnoses       Well woman exam    -  Primary   Relevant Orders   IGP,CtNgTv,Apt HPV,rfx16/18,45     Cervical cancer screening           1) Mammogram - recommend yearly screening mammogram.  Mammogram  is scheduled    2) STI screening  wasoffered and accepted swabs only   3) ASCCP guidelines and rational discussed.  Patient opts for  base don results  screening interval  4) Contraception - the patient is currently using  IUD.  She is happy with her current form of contraception and plans to continue  5) Colonoscopy -- Screening recommended starting at age 88 for average risk individuals, age 15 for individuals deemed at increased risk (including African Americans) and recommended to continue until age 46.  For patient age 15-85 individualized approach is recommended.  Gold standard screening is via colonoscopy, Cologuard screening is an acceptable alternative for patient unwilling or unable to undergo colonoscopy.  "Colorectal cancer screening for average?risk adults: 2018 guideline update from the American Cancer Society"CA: A Cancer Journal for Clinicians: Mar 23, 2017    6) Routine healthcare maintenance including cholesterol, diabetes screening discussed managed by PCP  7) Return in about 1 year (around 10/16/2024).  Carie Caddy, CNM  [facility] 10/17/2023, 1:01 PM

## 2023-10-24 LAB — IGP,CTNGTV,APT HPV,RFX16/18,45
Chlamydia, Nuc. Acid Amp: NEGATIVE
Gonococcus, Nuc. Acid Amp: NEGATIVE
HPV Aptima: NEGATIVE
Trich vag by NAA: NEGATIVE

## 2023-11-10 ENCOUNTER — Ambulatory Visit
Admission: RE | Admit: 2023-11-10 | Discharge: 2023-11-10 | Disposition: A | Discharge status: Home / Self Care | Payer: BC Managed Care – PPO | Source: Ambulatory Visit | Attending: Physician Assistant | Admitting: Physician Assistant

## 2023-11-10 DIAGNOSIS — Z1231 Encounter for screening mammogram for malignant neoplasm of breast: Secondary | ICD-10-CM | POA: Diagnosis not present

## 2023-11-11 DIAGNOSIS — S6701XA Crushing injury of right thumb, initial encounter: Secondary | ICD-10-CM | POA: Diagnosis not present

## 2023-11-11 DIAGNOSIS — S60311A Abrasion of right thumb, initial encounter: Secondary | ICD-10-CM | POA: Diagnosis not present

## 2023-11-11 DIAGNOSIS — X58XXXA Exposure to other specified factors, initial encounter: Secondary | ICD-10-CM | POA: Diagnosis not present

## 2023-11-14 ENCOUNTER — Other Ambulatory Visit: Payer: Self-pay | Admitting: Physician Assistant

## 2023-11-14 DIAGNOSIS — R928 Other abnormal and inconclusive findings on diagnostic imaging of breast: Secondary | ICD-10-CM

## 2023-11-15 ENCOUNTER — Encounter: Payer: Self-pay | Admitting: Physician Assistant

## 2023-11-15 NOTE — Progress Notes (Signed)
Your mammogram results are back. Your right breast had several calcifications that warrant further evaluation. We have sent in an order for a diagnostic mammogram of the right breast. You should be contacted soon to set this up and get your imaging scheduled. Let us know if you have further questions or concerns.

## 2023-11-16 ENCOUNTER — Ambulatory Visit
Admission: RE | Admit: 2023-11-16 | Discharge: 2023-11-16 | Disposition: A | Discharge status: Home / Self Care | Payer: BC Managed Care – PPO | Source: Ambulatory Visit | Attending: Family Medicine | Admitting: Family Medicine

## 2023-11-16 DIAGNOSIS — R928 Other abnormal and inconclusive findings on diagnostic imaging of breast: Secondary | ICD-10-CM | POA: Insufficient documentation

## 2023-11-16 DIAGNOSIS — R92321 Mammographic fibroglandular density, right breast: Secondary | ICD-10-CM | POA: Diagnosis not present

## 2023-11-16 DIAGNOSIS — R921 Mammographic calcification found on diagnostic imaging of breast: Secondary | ICD-10-CM | POA: Diagnosis not present

## 2024-01-04 ENCOUNTER — Encounter: Payer: Self-pay | Admitting: Family Medicine

## 2024-01-18 ENCOUNTER — Ambulatory Visit: Admitting: Family Medicine

## 2024-01-18 ENCOUNTER — Encounter: Payer: Self-pay | Admitting: Family Medicine

## 2024-01-18 VITALS — BP 120/84 | HR 71 | Resp 16 | Ht 63.0 in | Wt 145.0 lb

## 2024-01-18 DIAGNOSIS — R41840 Attention and concentration deficit: Secondary | ICD-10-CM

## 2024-01-18 DIAGNOSIS — J309 Allergic rhinitis, unspecified: Secondary | ICD-10-CM

## 2024-01-18 DIAGNOSIS — F419 Anxiety disorder, unspecified: Secondary | ICD-10-CM | POA: Diagnosis not present

## 2024-01-18 DIAGNOSIS — F3340 Major depressive disorder, recurrent, in remission, unspecified: Secondary | ICD-10-CM

## 2024-01-18 DIAGNOSIS — G4700 Insomnia, unspecified: Secondary | ICD-10-CM | POA: Diagnosis not present

## 2024-01-18 MED ORDER — LISDEXAMFETAMINE DIMESYLATE 30 MG PO CAPS: 30.0000 mg | ORAL_CAPSULE | Freq: Every day | ORAL | 0 refills | Status: DC

## 2024-01-18 MED ORDER — QUETIAPINE FUMARATE 25 MG PO TABS: 25.0000 mg | ORAL_TABLET | Freq: Every day | ORAL | 0 refills | Status: DC

## 2024-01-18 MED ORDER — LISDEXAMFETAMINE DIMESYLATE 20 MG PO CAPS: 20.0000 mg | ORAL_CAPSULE | Freq: Every day | ORAL | 0 refills | Status: DC

## 2024-01-18 MED ORDER — FLUTICASONE PROPIONATE 50 MCG/ACT NA SUSP: NASAL | 5 refills | Status: AC

## 2024-01-18 NOTE — Progress Notes (Deleted)
   01/18/24 1322  Resting  Supplemental oxygen during test? No  Resting Heart Rate 72  Resting Sp02 96  Lap 1 (250 feet)  HR 85  02 Sat 92  Lap 2 (250 feet)  HR 90  02 Sat 90  Lap 3 (250 feet)  HR 112  02 Sat 88

## 2024-01-18 NOTE — Progress Notes (Unsigned)
   01/18/24 1322  Resting  Supplemental oxygen during test? No  Resting Heart Rate 80  Resting Sp02 99  Lap 1 (250 feet)  HR 75  02 Sat 88

## 2024-01-18 NOTE — Progress Notes (Unsigned)
 Patient ID: Laura Watson, female    DOB: 04/02/1981, 43 y.o.   MRN: [id number]  PCP: Danelle Berry, PA-C  Chief Complaint  Patient presents with   Anxiety    Noticed increase in irritability    Subjective:   Laura Watson is a 43 y.o. female, presents to clinic with CC of the following:  HPI   Pt on lexapro and buspar, feels like her sx are not well controlled , wants to discuss other meds     01/18/2024    1:20 PM 10/06/2023    8:47 AM 07/06/2023    8:48 AM  Depression screen PHQ 2/9  Decreased Interest 0 0 0  Down, Depressed, Hopeless 0 0 0  PHQ - 2 Score 0 0 0  Altered sleeping 1 1 0  Tired, decreased energy 1 1 0  Change in appetite 0 0 0  Feeling bad or failure about yourself  0 0 0  Trouble concentrating 0 0 0  Moving slowly or fidgety/restless 0 0 0  Suicidal thoughts 0 0 0  PHQ-9 Score 2 2 0  Difficult doing work/chores   Not difficult at all      01/18/2024    1:20 PM 07/06/2023    8:48 AM 03/07/2023    9:31 AM 12/06/2022    3:27 PM  GAD 7 : Generalized Anxiety Score  Nervous, Anxious, on Edge 3 0 0 0  Control/stop worrying 2 0 0 0  Worry too much - different things 0 0 0 0  Trouble relaxing 1 0 0 0  Restless 0 0 0 0  Easily annoyed or irritable 3 0 0 0  Afraid - awful might happen 0 0 0 0  Total GAD 7 Score 9 0 0 0  Anxiety Difficulty  Not difficult at all Not difficult at all Not difficult at all   She has also not been able to get ADHD meds and this is making worse stress worse   Wt Readings from Last 5 Encounters:  01/18/24 145 lb (65.8 kg)  10/17/23 151 lb 14.4 oz (68.9 kg)  10/06/23 153 lb (69.4 kg)  07/06/23 171 lb 4.8 oz (77.7 kg)  12/06/22 190 lb 9.6 oz (86.5 kg)   BMI Readings from Last 5 Encounters:  01/18/24 25.69 kg/m  10/17/23 26.91 kg/m  10/06/23 27.10 kg/m  07/06/23 29.87 kg/m  12/06/22 33.23 kg/m   No past psychiatry - [facility] to [facility]    Patient Active Problem List   Diagnosis Date Noted    Advanced care planning/counseling discussion 10/06/2023   Overweight 07/06/2023   Hyperlipidemia 07/06/2023   Pelvic pain 08/30/2022   Dermatitis 08/30/2022   Dysuria 08/30/2022   Pain in joint, forearm 08/30/2022   Screening for malignant neoplasm of cervix 08/30/2022   Sore throat 08/30/2022   Prediabetes 07/27/2022   Insomnia 01/27/2022   Allergic rhinitis 01/27/2022   Gastroesophageal reflux disease 01/27/2022   Attention deficit disorder 01/27/2022   ASCUS with positive high risk HPV cervical 08/07/2021   Class 1 obesity with serious comorbidity and body mass index (BMI) of 33.0 to 33.9 in adult 04/09/2021   Vitamin D deficiency 04/09/2021   Anxiety disorder 01/16/2018   MDD (recurrent major depressive disorder) in remission (HCC) 01/16/2018   Family history of diabetes mellitus 01/16/2018      Current Outpatient Medications:    acetaminophen (TYLENOL) 325 MG tablet, Take 650 mg by mouth as needed., Disp: , Rfl:    busPIRone (  BUSPAR) 15 MG tablet, Take 0.5-1 tablets (7.5-15 mg total) by mouth 2 (two) times daily as needed., Disp: 180 tablet, Rfl: 3   Cholecalciferol (VITAMIN D3) 50 MCG (2000 UT) capsule, Take 1 capsule (2,000 Units total) by mouth daily., Disp: , Rfl:    clobetasol ointment (TEMOVATE) 0.05 %, Apply 1 Application topically 2 (two) times daily., Disp: 60 g, Rfl: 1   escitalopram (LEXAPRO) 20 MG tablet, Take 1 tablet (20 mg total) by mouth daily., Disp: 90 tablet, Rfl: 3   fluticasone (FLONASE) 50 MCG/ACT nasal spray, SHAKE LIQUID AND USE 2 SPRAYS IN EACH NOSTRIL DAILY, Disp: 16 g, Rfl: 5   ibuprofen (ADVIL,MOTRIN) 200 MG tablet, Take 400 mg by mouth as needed., Disp: , Rfl:    levonorgestrel (MIRENA) 20 MCG/24HR IUD, 1 Intra Uterine Device by Intrauterine route continuous., Disp: , Rfl:    lisdexamfetamine (VYVANSE) 30 MG capsule, Take 1 capsule (30 mg total) by mouth daily., Disp: 30 capsule, Rfl: 0   lisdexamfetamine (VYVANSE) 30 MG capsule, Take 1 capsule (30  mg total) by mouth daily., Disp: 30 capsule, Rfl: 0   lisdexamfetamine (VYVANSE) 30 MG capsule, Take 1 capsule (30 mg total) by mouth daily., Disp: 30 capsule, Rfl: 0   WEGOVY 1 MG/0.5ML SOAJ, Inject 1 mg into the skin once a week., Disp: , Rfl:    No Known Allergies   Social History   Tobacco Use   Smoking status: Never   Smokeless tobacco: Never  Vaping Use   Vaping status: Never Used  Substance Use Topics   Alcohol use: Not Currently   Drug use: No      Chart Review Today: I personally reviewed active problem list, medication list, allergies, family history, social history, health maintenance, notes from last encounter, lab results, imaging with the patient/caregiver today.    Review of Systems  Constitutional: Negative.   HENT: Negative.    Eyes: Negative.   Respiratory: Negative.    Cardiovascular: Negative.   Gastrointestinal: Negative.   Endocrine: Negative.   Genitourinary: Negative.   Musculoskeletal: Negative.   Skin: Negative.   Allergic/Immunologic: Negative.   Neurological: Negative.   Hematological: Negative.   Psychiatric/Behavioral: Negative.    All other systems reviewed and are negative.      Objective:   There were no vitals filed for this visit.  There is no height or weight on file to calculate BMI.  Physical Exam Vitals and nursing note reviewed.  Constitutional:      General: She is not in acute distress.    Appearance: Normal appearance. She is well-developed and normal weight. She is not ill-appearing, toxic-appearing or diaphoretic.  HENT:     Head: Normocephalic and atraumatic.     Nose: Nose normal.  Eyes:     General:        Right eye: No discharge.        Left eye: No discharge.     Conjunctiva/sclera: Conjunctivae normal.  Neck:     Trachea: No tracheal deviation.  Cardiovascular:     Rate and Rhythm: Normal rate and regular rhythm.  Pulmonary:     Effort: Pulmonary effort is normal. No respiratory distress.      Breath sounds: Normal breath sounds. No stridor.  Musculoskeletal:        General: Normal range of motion.  Skin:    General: Skin is warm and dry.     Findings: No rash.  Neurological:     Mental Status: She is alert.  Motor: No abnormal muscle tone.     Coordination: Coordination normal.  Psychiatric:        Mood and Affect: Mood normal.        Behavior: Behavior normal.      Results for orders placed or performed in visit on 10/17/23  IGP,CtNgTv,Apt HPV,rfx16/18,45   Collection Time: 10/17/23  9:07 AM  Result Value Ref Range   Interpretation NILM    Category NIL    Adequacy ENDO    Clinician Provided ICD10 Comment    Performed by: Comment    Note: Comment    Test Methodology Comment    HPV Aptima Negative Negative   HPV Genotype Reflex Comment    Chlamydia, Nuc. Acid Amp Negative Negative   Gonococcus, Nuc. Acid Amp Negative Negative   Trich vag by NAA Negative Negative       Assessment & Plan:   Attention or concentration deficit Assessment & Plan: Lack of med management may be making work and anxiety worse Will refill meds at local pharmacy, last Rx Nov, it was out of stock PDMP reviewed today Restart lower dose and can increase to 30 if tolerating, but pt encouraged to watch for weight loss, worsening insomnia or mood  Orders: -     Lisdexamfetamine Dimesylate; Take 1 capsule (20 mg total) by mouth daily.  Dispense: 30 capsule; Refill: 0 -     Lisdexamfetamine Dimesylate; Take 1 capsule (30 mg total) by mouth daily.  Dispense: 30 capsule; Refill: 0  Allergic rhinitis, unspecified seasonality, unspecified trigger Assessment & Plan: Meds refilled  Orders: -     Fluticasone Propionate; SHAKE LIQUID AND USE 2 SPRAYS IN EACH NOSTRIL DAILY  Dispense: 16 g; Refill: 5  Insomnia, unspecified type Assessment & Plan: Trouble sleeping lately Will do trial of seroquel to see if may help with sleep and anxiety Previously did poorly with trazodone, she felt sleepy  all the next day Other med options discussed  Orders: -     QUEtiapine Fumarate; Take 1 tablet (25 mg total) by mouth at bedtime.  Dispense: 30 tablet; Refill: 0  Anxiety disorder, unspecified type Assessment & Plan: Worse anxiety for several months Stress and irritation at work She has been doing well for years with lexapro 20 mg and buspar For now will keep meds the same and see if refills for adhd meds will help, trial of seroquel at night If no improvement with adhd meds we will do close f/up and discuss possible change of lexapro to other SSRI or SNRI    01/18/2024    1:20 PM 07/06/2023    8:48 AM 03/07/2023    9:31 AM 12/06/2022    3:27 PM  GAD 7 : Generalized Anxiety Score  Nervous, Anxious, on Edge 3 0 0 0  Control/stop worrying 2 0 0 0  Worry too much - different things 0 0 0 0  Trouble relaxing 1 0 0 0  Restless 0 0 0 0  Easily annoyed or irritable 3 0 0 0  Afraid - awful might happen 0 0 0 0  Total GAD 7 Score 9 0 0 0  Anxiety Difficulty  Not difficult at all Not difficult at all Not difficult at all     MDD (recurrent major depressive disorder) in remission [facility]) Assessment & Plan:    01/18/2024    1:20 PM 10/06/2023    8:47 AM 07/06/2023    8:48 AM 03/07/2023    9:31 AM 12/06/2022    3:27 PM  Depression screen PHQ 2/9  Decreased Interest 0 0 0 0 0  Down, Depressed, Hopeless 0 0 0 0 0  PHQ - 2 Score 0 0 0 0 0  Altered sleeping 1 1 0 0 0  Tired, decreased energy 1 1 0 0 0  Change in appetite 0 0 0 0 0  Feeling bad or failure about yourself  0 0 0 0 0  Trouble concentrating 0 0 0 0 0  Moving slowly or fidgety/restless 0 0 0 0 0  Suicidal thoughts 0 0 0 0 0  PHQ-9 Score 2 2 0 0 0  Difficult doing work/chores   Not difficult at all Not difficult at all Not difficult at all  Phq 9 reviewed and neg      We also discussed local psychiatry which would be helpful for assessment and med management and making therapy accessible  She was going to check with her  insurance about [facility] vs [facility] and get back to me so I can put in a referral.    Return in about 4 weeks (around 02/15/2024) for f/up med changes .   Danelle Berry, PA-C 01/18/24 1:09 PM

## 2024-01-18 NOTE — Patient Instructions (Signed)
 [email or web address] See if [facility] is in your insurance network Or [facility]

## 2024-01-19 ENCOUNTER — Encounter: Payer: Self-pay | Admitting: Family Medicine

## 2024-01-19 NOTE — Assessment & Plan Note (Signed)
 Worse anxiety for several months Stress and irritation at work She has been doing well for years with lexapro 20 mg and buspar For now will keep meds the same and see if refills for adhd meds will help, trial of seroquel at night If no improvement with adhd meds we will do close f/up and discuss possible change of lexapro to other SSRI or SNRI    01/18/2024    1:20 PM 07/06/2023    8:48 AM 03/07/2023    9:31 AM 12/06/2022    3:27 PM  GAD 7 : Generalized Anxiety Score  Nervous, Anxious, on Edge 3 0 0 0  Control/stop worrying 2 0 0 0  Worry too much - different things 0 0 0 0  Trouble relaxing 1 0 0 0  Restless 0 0 0 0  Easily annoyed or irritable 3 0 0 0  Afraid - awful might happen 0 0 0 0  Total GAD 7 Score 9 0 0 0  Anxiety Difficulty  Not difficult at all Not difficult at all Not difficult at all

## 2024-01-19 NOTE — Assessment & Plan Note (Signed)
    01/18/2024    1:20 PM 10/06/2023    8:47 AM 07/06/2023    8:48 AM 03/07/2023    9:31 AM 12/06/2022    3:27 PM  Depression screen PHQ 2/9  Decreased Interest 0 0 0 0 0  Down, Depressed, Hopeless 0 0 0 0 0  PHQ - 2 Score 0 0 0 0 0  Altered sleeping 1 1 0 0 0  Tired, decreased energy 1 1 0 0 0  Change in appetite 0 0 0 0 0  Feeling bad or failure about yourself  0 0 0 0 0  Trouble concentrating 0 0 0 0 0  Moving slowly or fidgety/restless 0 0 0 0 0  Suicidal thoughts 0 0 0 0 0  PHQ-9 Score 2 2 0 0 0  Difficult doing work/chores   Not difficult at all Not difficult at all Not difficult at all  Phq 9 reviewed and neg

## 2024-01-19 NOTE — Assessment & Plan Note (Signed)
 Lack of med management may be making work and anxiety worse Will refill meds at [facility], last Rx Nov, it was out of stock PDMP reviewed today Restart lower dose and can increase to 30 if tolerating, but pt encouraged to watch for weight loss, worsening insomnia or mood

## 2024-01-19 NOTE — Assessment & Plan Note (Signed)
 Meds refilled.

## 2024-01-19 NOTE — Assessment & Plan Note (Signed)
 Trouble sleeping lately Will do trial of seroquel to see if may help with sleep and anxiety Previously did poorly with trazodone, she felt sleepy all the next day Other med options discussed

## 2024-01-25 NOTE — Addendum Note (Signed)
 Addended by: Danelle Berry on: 01/25/2024 02:37 PM   Modules accepted: Orders

## 2024-02-25 ENCOUNTER — Other Ambulatory Visit: Payer: Self-pay | Admitting: Family Medicine

## 2024-02-25 DIAGNOSIS — G4700 Insomnia, unspecified: Secondary | ICD-10-CM

## 2024-02-28 ENCOUNTER — Other Ambulatory Visit: Payer: Self-pay

## 2024-02-28 DIAGNOSIS — R928 Other abnormal and inconclusive findings on diagnostic imaging of breast: Secondary | ICD-10-CM

## 2024-02-28 NOTE — Telephone Encounter (Signed)
 Requested medication (s) are due for refill today - yes  Requested medication (s) are on the active medication list -yes  Future visit scheduled no  Last refill: 01/18/24 #30   Notes to clinic: non delegated Rx  Requested Prescriptions  Pending Prescriptions Disp Refills   QUEtiapine  (SEROQUEL ) 25 MG tablet [Pharmacy Med Name: QUETIAPINE  25MG  TABLETS] 30 tablet 0    Sig: TAKE 1 TABLET(25 MG) BY MOUTH AT BEDTIME     Not Delegated - Psychiatry:  Antipsychotics - Second Generation (Atypical) - quetiapine  Failed - 02/28/2024 11:39 AM      Failed - This refill cannot be delegated      Failed - Valid encounter within last 6 months    Recent Outpatient Visits           1 month ago Attention or concentration deficit   [facility] [place], Jennie Moeller, PA-C              Failed - Lipid Panel in normal range within the last 12 months    Cholesterol, Total  Date Value Ref Range Status  10/07/2023 149 100 - 199 mg/dL Final   LDL Cholesterol (Calc)  Date Value Ref Range Status  02/10/2021 116 (H) mg/dL (calc) Final    Comment:    Reference range: <100 . Desirable range <100 mg/dL for primary prevention;   <70 mg/dL for patients with CHD or diabetic patients  with > or = 2 CHD risk factors. Aaron Aas LDL-C is now calculated using the Martin-Hopkins  calculation, which is a validated novel method providing  better accuracy than the Friedewald equation in the  estimation of LDL-C.  Melinda Sprawls et al. Erroll Heard. 6962;952(84): 2061-2068  (http://education.[facility].com/faq/FAQ164)    LDL Chol Calc (NIH)  Date Value Ref Range Status  10/07/2023 94 0 - 99 mg/dL Final   HDL  Date Value Ref Range Status  10/07/2023 43 >39 mg/dL Final   Triglycerides  Date Value Ref Range Status  10/07/2023 57 0 - 149 mg/dL Final         Passed - TSH in normal range and within 360 days    TSH  Date Value Ref Range Status  10/07/2023 1.380 0.450 - 4.500 uIU/mL Final          Passed - Completed PHQ-2 or PHQ-9 in the last 360 days      Passed - Last BP in normal range    BP Readings from Last 1 Encounters:  01/18/24 120/84         Passed - Last Heart Rate in normal range    Pulse Readings from Last 1 Encounters:  01/18/24 71         Passed - CBC within normal limits and completed in the last 12 months    WBC  Date Value Ref Range Status  10/07/2023 8.2 3.4 - 10.8 x10E3/uL Final  02/10/2021 10.8 3.8 - 10.8 Thousand/uL Final   RBC  Date Value Ref Range Status  10/07/2023 4.31 3.77 - 5.28 x10E6/uL Final  02/10/2021 4.53 3.80 - 5.10 Million/uL Final   Hemoglobin  Date Value Ref Range Status  10/07/2023 13.1 11.1 - 15.9 g/dL Final   Hematocrit  Date Value Ref Range Status  10/07/2023 39.0 34.0 - 46.6 % Final   MCHC  Date Value Ref Range Status  10/07/2023 33.6 31.5 - 35.7 g/dL Final  13/24/4010 27.2 32.0 - 36.0 g/dL Final   [facility]  Date Value Ref Range Status  10/07/2023 30.4 26.6 -  33.0 pg Final  02/10/2021 30.0 27.0 - 33.0 pg Final   MCV  Date Value Ref Range Status  10/07/2023 91 79 - 97 fL Final   No results found for: "PLTCOUNTKUC", "LABPLAT", "POCPLA" RDW  Date Value Ref Range Status  10/07/2023 12.8 11.7 - 15.4 % Final         Passed - CMP within normal limits and completed in the last 12 months    Albumin  Date Value Ref Range Status  10/07/2023 4.5 3.9 - 4.9 g/dL Final   Alkaline Phosphatase  Date Value Ref Range Status  10/07/2023 72 44 - 121 IU/L Final   Alkaline phosphatase (APISO)  Date Value Ref Range Status  02/10/2021 85 31 - 125 U/L Final   ALT  Date Value Ref Range Status  10/07/2023 11 0 - 32 IU/L Final   AST  Date Value Ref Range Status  10/07/2023 15 0 - 40 IU/L Final   BUN  Date Value Ref Range Status  10/07/2023 7 6 - 24 mg/dL Final   Calcium  Date Value Ref Range Status  10/07/2023 9.4 8.7 - 10.2 mg/dL Final   CO2  Date Value Ref Range Status  10/07/2023 20 20 - 29 mmol/L Final   Creat   Date Value Ref Range Status  02/10/2021 0.80 0.50 - 1.10 mg/dL Final   Creatinine, Ser  Date Value Ref Range Status  10/07/2023 0.75 0.57 - 1.00 mg/dL Final   Glucose  Date Value Ref Range Status  10/07/2023 79 70 - 99 mg/dL Final  16/07/9603 80  Final   Glucose, Bld  Date Value Ref Range Status  02/10/2021 74 65 - 99 mg/dL Final    Comment:    .            Fasting reference interval .    Potassium  Date Value Ref Range Status  10/07/2023 4.1 3.5 - 5.2 mmol/L Final   Sodium  Date Value Ref Range Status  10/07/2023 140 134 - 144 mmol/L Final   Bilirubin Total  Date Value Ref Range Status  10/07/2023 0.7 0.0 - 1.2 mg/dL Final   Protein, ur  Date Value Ref Range Status  07/07/2017 NEGATIVE NEGATIVE mg/dL Final   Total Protein  Date Value Ref Range Status  10/07/2023 6.8 6.0 - 8.5 g/dL Final   GFR, Est African American  Date Value Ref Range Status  02/10/2021 107 > OR = 60 mL/min/1.62m2 Final   eGFR  Date Value Ref Range Status  10/07/2023 101 >59 mL/min/1.73 Final   GFR, Est Non African American  Date Value Ref Range Status  02/10/2021 92 > OR = 60 mL/min/1.16m2 Final            Requested Prescriptions  Pending Prescriptions Disp Refills   QUEtiapine  (SEROQUEL ) 25 MG tablet [Pharmacy Med Name: QUETIAPINE  25MG  TABLETS] 30 tablet 0    Sig: TAKE 1 TABLET(25 MG) BY MOUTH AT BEDTIME     Not Delegated - Psychiatry:  Antipsychotics - Second Generation (Atypical) - quetiapine  Failed - 02/28/2024 11:39 AM      Failed - This refill cannot be delegated      Failed - Valid encounter within last 6 months    Recent Outpatient Visits           1 month ago Attention or concentration deficit   [facility], Jennie Moeller, PA-C              Failed - Lipid Panel in  normal range within the last 12 months    Cholesterol, Total  Date Value Ref Range Status  10/07/2023 149 100 - 199 mg/dL Final   LDL Cholesterol (Calc)  Date Value  Ref Range Status  02/10/2021 116 (H) mg/dL (calc) Final    Comment:    Reference range: <100 . Desirable range <100 mg/dL for primary prevention;   <70 mg/dL for patients with CHD or diabetic patients  with > or = 2 CHD risk factors. Aaron Aas LDL-C is now calculated using the Martin-Hopkins  calculation, which is a validated novel method providing  better accuracy than the Friedewald equation in the  estimation of LDL-C.  Melinda Sprawls et al. Erroll Heard. 1610;960(45): 2061-2068  (http://education.[facility].com/faq/FAQ164)    LDL Chol Calc (NIH)  Date Value Ref Range Status  10/07/2023 94 0 - 99 mg/dL Final   HDL  Date Value Ref Range Status  10/07/2023 43 >39 mg/dL Final   Triglycerides  Date Value Ref Range Status  10/07/2023 57 0 - 149 mg/dL Final         Passed - TSH in normal range and within 360 days    TSH  Date Value Ref Range Status  10/07/2023 1.380 0.450 - 4.500 uIU/mL Final         Passed - Completed PHQ-2 or PHQ-9 in the last 360 days      Passed - Last BP in normal range    BP Readings from Last 1 Encounters:  01/18/24 120/84         Passed - Last Heart Rate in normal range    Pulse Readings from Last 1 Encounters:  01/18/24 71         Passed - CBC within normal limits and completed in the last 12 months    WBC  Date Value Ref Range Status  10/07/2023 8.2 3.4 - 10.8 x10E3/uL Final  02/10/2021 10.8 3.8 - 10.8 Thousand/uL Final   RBC  Date Value Ref Range Status  10/07/2023 4.31 3.77 - 5.28 x10E6/uL Final  02/10/2021 4.53 3.80 - 5.10 Million/uL Final   Hemoglobin  Date Value Ref Range Status  10/07/2023 13.1 11.1 - 15.9 g/dL Final   Hematocrit  Date Value Ref Range Status  10/07/2023 39.0 34.0 - 46.6 % Final   MCHC  Date Value Ref Range Status  10/07/2023 33.6 31.5 - 35.7 g/dL Final  40/98/1191 47.8 32.0 - 36.0 g/dL Final   [facility]  Date Value Ref Range Status  10/07/2023 30.4 26.6 - 33.0 pg Final  02/10/2021 30.0 27.0 - 33.0 pg Final   MCV   Date Value Ref Range Status  10/07/2023 91 79 - 97 fL Final   No results found for: "PLTCOUNTKUC", "LABPLAT", "POCPLA" RDW  Date Value Ref Range Status  10/07/2023 12.8 11.7 - 15.4 % Final         Passed - CMP within normal limits and completed in the last 12 months    Albumin  Date Value Ref Range Status  10/07/2023 4.5 3.9 - 4.9 g/dL Final   Alkaline Phosphatase  Date Value Ref Range Status  10/07/2023 72 44 - 121 IU/L Final   Alkaline phosphatase (APISO)  Date Value Ref Range Status  02/10/2021 85 31 - 125 U/L Final   ALT  Date Value Ref Range Status  10/07/2023 11 0 - 32 IU/L Final   AST  Date Value Ref Range Status  10/07/2023 15 0 - 40 IU/L Final   BUN  Date Value Ref Range Status  10/07/2023 7 6 - 24 mg/dL Final   Calcium  Date Value Ref Range Status  10/07/2023 9.4 8.7 - 10.2 mg/dL Final   CO2  Date Value Ref Range Status  10/07/2023 20 20 - 29 mmol/L Final   Creat  Date Value Ref Range Status  02/10/2021 0.80 0.50 - 1.10 mg/dL Final   Creatinine, Ser  Date Value Ref Range Status  10/07/2023 0.75 0.57 - 1.00 mg/dL Final   Glucose  Date Value Ref Range Status  10/07/2023 79 70 - 99 mg/dL Final  13/05/6577 80  Final   Glucose, Bld  Date Value Ref Range Status  02/10/2021 74 65 - 99 mg/dL Final    Comment:    .            Fasting reference interval .    Potassium  Date Value Ref Range Status  10/07/2023 4.1 3.5 - 5.2 mmol/L Final   Sodium  Date Value Ref Range Status  10/07/2023 140 134 - 144 mmol/L Final   Bilirubin Total  Date Value Ref Range Status  10/07/2023 0.7 0.0 - 1.2 mg/dL Final   Protein, ur  Date Value Ref Range Status  07/07/2017 NEGATIVE NEGATIVE mg/dL Final   Total Protein  Date Value Ref Range Status  10/07/2023 6.8 6.0 - 8.5 g/dL Final   GFR, Est African American  Date Value Ref Range Status  02/10/2021 107 > OR = 60 mL/min/1.69m2 Final   eGFR  Date Value Ref Range Status  10/07/2023 101 >59 mL/min/1.73  Final   GFR, Est Non African American  Date Value Ref Range Status  02/10/2021 92 > OR = 60 mL/min/1.65m2 Final

## 2024-02-28 NOTE — Progress Notes (Signed)
 F/up mammogram imaging signed - per pt preference to do 6 month f/up Reviewed recent mammogram and f/up options  Pt will be notified orders are ready to go and she can schedule the 6 month f/up    ICD-10-CM   1. Abnormal mammogram of right breast  R92.8 MM 3D DIAGNOSTIC MAMMOGRAM UNILATERAL RIGHT BREAST    US  LIMITED ULTRASOUND INCLUDING AXILLA RIGHT BREAST      Adeline Hone, PA-C

## 2024-03-06 ENCOUNTER — Encounter: Payer: Self-pay | Admitting: Family Medicine

## 2024-03-06 ENCOUNTER — Ambulatory Visit: Admitting: Family Medicine

## 2024-03-06 VITALS — BP 128/82 | HR 96 | Resp 16 | Ht 63.0 in | Wt 134.0 lb

## 2024-03-06 DIAGNOSIS — Z79899 Other long term (current) drug therapy: Secondary | ICD-10-CM

## 2024-03-06 DIAGNOSIS — F3340 Major depressive disorder, recurrent, in remission, unspecified: Secondary | ICD-10-CM

## 2024-03-06 DIAGNOSIS — F321 Major depressive disorder, single episode, moderate: Secondary | ICD-10-CM

## 2024-03-06 DIAGNOSIS — F419 Anxiety disorder, unspecified: Secondary | ICD-10-CM | POA: Diagnosis not present

## 2024-03-06 DIAGNOSIS — G4700 Insomnia, unspecified: Secondary | ICD-10-CM | POA: Diagnosis not present

## 2024-03-06 DIAGNOSIS — R41840 Attention and concentration deficit: Secondary | ICD-10-CM

## 2024-03-06 MED ORDER — LISDEXAMFETAMINE DIMESYLATE 30 MG PO CAPS: 30.0000 mg | ORAL_CAPSULE | Freq: Every day | ORAL | 0 refills | Status: DC

## 2024-03-06 MED ORDER — ESCITALOPRAM OXALATE 20 MG PO TABS: 20.0000 mg | ORAL_TABLET | Freq: Every day | ORAL | 3 refills | Status: AC

## 2024-03-06 MED ORDER — QUETIAPINE FUMARATE 25 MG PO TABS: 25.0000 mg | ORAL_TABLET | Freq: Every day | ORAL | 1 refills | Status: DC

## 2024-03-06 NOTE — Assessment & Plan Note (Signed)
 Symptoms much better controlled now that she is getting better sleep and back on her ADD medications she has continued to take Lexapro  and BuSpar  PHQ-9 and GAD-7 reviewed today and scores improved from prior office visit

## 2024-03-06 NOTE — Assessment & Plan Note (Signed)
 All of her anxiety and mood symptoms are improved with being able to get her Vyvanse  medication filled she was going without due to lack of supply at her pharmacy she changed the pharmacy to [facility] and has had no problems getting her medications, she resumed at 20 mg and then increased back to 30 mg her prior dose and she notes no side effects or concerns PDMP was reviewed today Postdated refills x 3 ordered 3 month f/up virtual ok

## 2024-03-06 NOTE — Assessment & Plan Note (Signed)
 Seroquel  has worked really well for her without any lingering side effects She is not taking every night, 90-day refills ordered With lexapro  checked ECG today to make sure there was no QTc prolongation and QTc was normal, would still recommend watching for other QTc prolonging medications in combination such as caution with Zofran  or Zpak, but currently no concern with lexapro  daily and seroquel  prn QHS

## 2024-03-06 NOTE — Progress Notes (Signed)
 Name: Laura Watson   MRN: [id number]    DOB: 03/11/1981   Date:03/06/2024       Progress Note  Chief Complaint  Patient presents with   Follow-up     Subjective:   Laura Watson is a 43 y.o. female, presents to clinic for routine follow up on chronic conditions  Here for f/up on mood/irritability after worse moods/anxiety and restarting ADHD meds   She feels much better back on vyvanse  did 20 mg last month and then recent refill was 30 mg She also tried Seroquel  for insomnia and this works better for her than trazodone  without side effects the next day She kept her Lexapro  the same and still used BuSpar  as needed Overall she reports significantly improved mood and less irritability at work     03/06/2024   11:22 AM 01/18/2024    1:20 PM 10/06/2023    8:47 AM 07/06/2023    8:48 AM 03/07/2023    9:31 AM  Depression screen PHQ 2/9  Decreased Interest 0 0 0 0 0  Down, Depressed, Hopeless 0 0 0 0 0  PHQ - 2 Score 0 0 0 0 0  Altered sleeping 1 1 1  0 0  Tired, decreased energy 1 1 1  0 0  Change in appetite 0 0 0 0 0  Feeling bad or failure about yourself  0 0 0 0 0  Trouble concentrating 0 0 0 0 0  Moving slowly or fidgety/restless 0 0 0 0 0  Suicidal thoughts 0 0 0 0 0  PHQ-9 Score 2 2 2  0 0  Difficult doing work/chores Not difficult at all   Not difficult at all Not difficult at all      03/06/2024   11:23 AM 01/18/2024    1:20 PM 07/06/2023    8:48 AM 03/07/2023    9:31 AM  GAD 7 : Generalized Anxiety Score  Nervous, Anxious, on Edge 0 3 0 0  Control/stop worrying 0 2 0 0  Worry too much - different things 0 0 0 0  Trouble relaxing 1 1 0 0  Restless 1 0 0 0  Easily annoyed or irritable 1 3 0 0  Afraid - awful might happen 0 0 0 0  Total GAD 7 Score 3 9 0 0  Anxiety Difficulty Not difficult at all  Not difficult at all Not difficult at all   ADHD meds are working and she is not having any SE, she denied any problems with decreased appetite, change in  mood, insomnia and her blood pressure and heart rate were reviewed today  Now on Lexapro  and Seroquel  (Seroquel  she was using some nights not every single night) a baseline EKG was done today   She continues to lose weight her goal weight is to be between 130 and 135 she is currently on Wegovy 1.7 mg dose weekly and she believes that with the specialist they are going to start to decrease her dose gradually Its done with a program with her insurance/work and done in conjunction with weight watchers On wegovy with specialist - losing weight Wt Readings from Last 5 Encounters:  03/06/24 134 lb (60.8 kg)  01/18/24 145 lb (65.8 kg)  10/17/23 151 lb 14.4 oz (68.9 kg)  10/06/23 153 lb (69.4 kg)  07/06/23 171 lb 4.8 oz (77.7 kg)   BMI Readings from Last 5 Encounters:  03/06/24 23.74 kg/m  01/18/24 25.69 kg/m  10/17/23 26.91 kg/m  10/06/23 27.10 kg/m  07/06/23 29.87 kg/m        Current Outpatient Medications:    acetaminophen  (TYLENOL ) 325 MG tablet, Take 650 mg by mouth as needed., Disp: , Rfl:    busPIRone  (BUSPAR ) 15 MG tablet, Take 0.5-1 tablets (7.5-15 mg total) by mouth 2 (two) times daily as needed., Disp: 180 tablet, Rfl: 3   Cholecalciferol (VITAMIN D3) 50 MCG (2000 UT) capsule, Take 1 capsule (2,000 Units total) by mouth daily., Disp: , Rfl:    clobetasol  ointment (TEMOVATE ) 0.05 %, Apply 1 Application topically 2 (two) times daily., Disp: 60 g, Rfl: 1   escitalopram  (LEXAPRO ) 20 MG tablet, Take 1 tablet (20 mg total) by mouth daily., Disp: 90 tablet, Rfl: 3   fluticasone  (FLONASE ) 50 MCG/ACT nasal spray, SHAKE LIQUID AND USE 2 SPRAYS IN EACH NOSTRIL DAILY, Disp: 16 g, Rfl: 5   ibuprofen (ADVIL,MOTRIN) 200 MG tablet, Take 400 mg by mouth as needed., Disp: , Rfl:    levonorgestrel  (MIRENA ) 20 MCG/24HR IUD, 1 Intra Uterine Device by Intrauterine route continuous., Disp: , Rfl:    lisdexamfetamine (VYVANSE ) 20 MG capsule, Take 1 capsule (20 mg total) by mouth daily., Disp: 30  capsule, Rfl: 0   lisdexamfetamine (VYVANSE ) 30 MG capsule, Take 1 capsule (30 mg total) by mouth daily., Disp: 30 capsule, Rfl: 0   lisdexamfetamine (VYVANSE ) 30 MG capsule, Take 1 capsule (30 mg total) by mouth daily., Disp: 30 capsule, Rfl: 0   lisdexamfetamine (VYVANSE ) 30 MG capsule, Take 1 capsule (30 mg total) by mouth daily., Disp: 30 capsule, Rfl: 0   lisdexamfetamine (VYVANSE ) 30 MG capsule, Take 1 capsule (30 mg total) by mouth daily., Disp: 30 capsule, Rfl: 0   QUEtiapine  (SEROQUEL ) 25 MG tablet, Take 1 tablet (25 mg total) by mouth at bedtime., Disp: 30 tablet, Rfl: 0   WEGOVY 1 MG/0.5ML SOAJ, Inject 1 mg into the skin once a week., Disp: , Rfl:   Patient Active Problem List   Diagnosis Date Noted   Advanced care planning/counseling discussion 10/06/2023   Overweight 07/06/2023   Hyperlipidemia 07/06/2023   Pelvic pain 08/30/2022   Dermatitis 08/30/2022   Dysuria 08/30/2022   Pain in joint, forearm 08/30/2022   Screening for malignant neoplasm of cervix 08/30/2022   Sore throat 08/30/2022   Prediabetes 07/27/2022   Insomnia 01/27/2022   Allergic rhinitis 01/27/2022   Gastroesophageal reflux disease 01/27/2022   Attention deficit disorder 01/27/2022   ASCUS with positive high risk HPV cervical 08/07/2021   Class 1 obesity with serious comorbidity and body mass index (BMI) of 33.0 to 33.9 in adult 04/09/2021   Vitamin D  deficiency 04/09/2021   Anxiety disorder 01/16/2018   MDD (recurrent major depressive disorder) in remission (HCC) 01/16/2018   Family history of diabetes mellitus 01/16/2018    Past Surgical History:  Procedure Laterality Date   APPENDECTOMY  2009   BREAST REDUCTION SURGERY Bilateral 01/16/2020   Procedure: MAMMARY REDUCTION  (BREAST);  Surgeon: Thornell Flirt, DO;  Location: [facility];  Service: Plastics;  Laterality: Bilateral;  3.5 hours   CESAREAN SECTION     x2   REDUCTION MAMMAPLASTY Bilateral 12/2019   WISDOM TOOTH  EXTRACTION  07/01/2022    Family History  Problem Relation Age of Onset   Diabetes Mother    Cancer Mother 41       lymphoma   Dementia Mother 63   Diabetes Father    Pulmonary fibrosis Father    Diabetes Sister    Diabetes Brother  Dementia Maternal Grandmother    Alzheimer's disease Maternal Grandfather    Breast cancer Neg Hx     Social History   Tobacco Use   Smoking status: Never   Smokeless tobacco: Never  Vaping Use   Vaping status: Never Used  Substance Use Topics   Alcohol use: Not Currently   Drug use: No     No Known Allergies  Health Maintenance  Topic Date Due   COVID-19 Vaccine (3 - 2024-25 season) 06/26/2023   INFLUENZA VACCINE  05/25/2024   MAMMOGRAM  11/09/2024   Cervical Cancer Screening (HPV/Pap Cotest)  10/16/2028   DTaP/Tdap/Td (2 - Td or Tdap) 01/17/2029   Hepatitis C Screening  Completed   HIV Screening  Completed   HPV VACCINES  Aged Out   Meningococcal B Vaccine  Aged Out    Chart Review Today: I personally reviewed active problem list, medication list, allergies, family history, social history, health maintenance, notes from last encounter, lab results, imaging with the patient/caregiver today.   Review of Systems  Constitutional: Negative.   HENT: Negative.    Eyes: Negative.   Respiratory: Negative.    Cardiovascular: Negative.   Gastrointestinal: Negative.   Endocrine: Negative.   Genitourinary: Negative.   Musculoskeletal: Negative.   Skin: Negative.   Allergic/Immunologic: Negative.   Neurological: Negative.   Hematological: Negative.   Psychiatric/Behavioral: Negative.    All other systems reviewed and are negative.    Objective:   There were no vitals filed for this visit.  There is no height or weight on file to calculate BMI.  Physical Exam Vitals and nursing note reviewed.  Constitutional:      Appearance: She is well-developed.  HENT:     Head: Normocephalic and atraumatic.     Nose: Nose normal.   Eyes:     General:        Right eye: No discharge.        Left eye: No discharge.     Conjunctiva/sclera: Conjunctivae normal.  Neck:     Trachea: No tracheal deviation.  Cardiovascular:     Rate and Rhythm: Normal rate and regular rhythm.     Pulses: Normal pulses.     Heart sounds: Normal heart sounds. No murmur heard.    No friction rub. No gallop.  Pulmonary:     Effort: Pulmonary effort is normal. No respiratory distress.     Breath sounds: No stridor.  Musculoskeletal:        General: Normal range of motion.  Skin:    General: Skin is warm and dry.     Findings: No rash.  Neurological:     Mental Status: She is alert.     Motor: No abnormal muscle tone.     Coordination: Coordination normal.  Psychiatric:        Attention and Perception: Attention normal.        Mood and Affect: Mood and affect normal.        Speech: Speech normal.        Behavior: Behavior normal.      Functional Status Survey:   Results for orders placed or performed in visit on 10/17/23  IGP,CtNgTv,Apt HPV,rfx16/18,45   Collection Time: 10/17/23  9:07 AM  Result Value Ref Range   Interpretation NILM    Category NIL    Adequacy ENDO    Clinician Provided ICD10 Comment    Performed by: Comment    Note: Comment    Test Methodology Comment  HPV Aptima Negative Negative   HPV Genotype Reflex Comment    Chlamydia, Nuc. Acid Amp Negative Negative   Gonococcus, Nuc. Acid Amp Negative Negative   Trich vag by NAA Negative Negative      Assessment & Plan:   Anxiety disorder, unspecified type Assessment & Plan: Symptoms much better controlled now that she is getting better sleep and back on her ADD medications she has continued to take Lexapro  and BuSpar  PHQ-9 and GAD-7 reviewed today and scores improved from prior office visit  Orders: -     Escitalopram  Oxalate; Take 1 tablet (20 mg total) by mouth daily.  Dispense: 90 tablet; Refill: 3  Current moderate episode of major depressive  disorder without prior episode (HCC) -     Escitalopram  Oxalate; Take 1 tablet (20 mg total) by mouth daily.  Dispense: 90 tablet; Refill: 3  Attention or concentration deficit Assessment & Plan: All of her anxiety and mood symptoms are improved with being able to get her Vyvanse  medication filled she was going without due to lack of supply at her pharmacy she changed the pharmacy to [facility] and has had no problems getting her medications, she resumed at 20 mg and then increased back to 30 mg her prior dose and she notes no side effects or concerns PDMP was reviewed today Postdated refills x 3 ordered 3 month f/up virtual ok  Orders: -     Lisdexamfetamine Dimesylate ; Take 1 capsule (30 mg total) by mouth daily.  Dispense: 30 capsule; Refill: 0 -     Lisdexamfetamine Dimesylate ; Take 1 capsule (30 mg total) by mouth daily.  Dispense: 30 capsule; Refill: 0 -     Lisdexamfetamine Dimesylate ; Take 1 capsule (30 mg total) by mouth daily.  Dispense: 30 capsule; Refill: 0  Insomnia, unspecified type Assessment & Plan: Seroquel  has worked really well for her without any lingering side effects She is not taking every night, 90-day refills ordered With lexapro  checked ECG today to make sure there was no QTc prolongation and QTc was normal, would still recommend watching for other QTc prolonging medications in combination such as caution with Zofran  or Zpak, but currently no concern with lexapro  daily and seroquel  prn QHS  Orders: -     QUEtiapine  Fumarate; Take 1 tablet (25 mg total) by mouth at bedtime.  Dispense: 90 tablet; Refill: 1  High risk medication use -     EKG 12-Lead  MDD (recurrent major depressive disorder) in remission [facility]) Assessment & Plan: Improve PHQ-9 and symptoms See above    03/06/2024   11:22 AM 01/18/2024    1:20 PM 10/06/2023    8:47 AM  Depression screen PHQ 2/9  Decreased Interest 0 0 0  Down, Depressed, Hopeless 0 0 0  PHQ - 2 Score 0 0 0  Altered sleeping  1 1 1   Tired, decreased energy 1 1 1   Change in appetite 0 0 0  Feeling bad or failure about yourself  0 0 0  Trouble concentrating 0 0 0  Moving slowly or fidgety/restless 0 0 0  Suicidal thoughts 0 0 0  PHQ-9 Score 2 2 2   Difficult doing work/chores Not difficult at all     Continue lexapro  20 mg daily, refills ordered       Return for 3 month virtual ADD med refill appt, 6 month routine f/up in office .   Adeline Hone, PA-C 03/06/24 10:54 AM

## 2024-03-06 NOTE — Assessment & Plan Note (Signed)
 Improve PHQ-9 and symptoms See above    03/06/2024   11:22 AM 01/18/2024    1:20 PM 10/06/2023    8:47 AM  Depression screen PHQ 2/9  Decreased Interest 0 0 0  Down, Depressed, Hopeless 0 0 0  PHQ - 2 Score 0 0 0  Altered sleeping 1 1 1   Tired, decreased energy 1 1 1   Change in appetite 0 0 0  Feeling bad or failure about yourself  0 0 0  Trouble concentrating 0 0 0  Moving slowly or fidgety/restless 0 0 0  Suicidal thoughts 0 0 0  PHQ-9 Score 2 2 2   Difficult doing work/chores Not difficult at all     Continue lexapro  20 mg daily, refills ordered

## 2024-05-18 ENCOUNTER — Ambulatory Visit
Admission: RE | Admit: 2024-05-18 | Discharge: 2024-05-18 | Disposition: A | Discharge status: Home / Self Care | Source: Ambulatory Visit | Attending: Family Medicine | Admitting: Family Medicine

## 2024-05-18 DIAGNOSIS — R928 Other abnormal and inconclusive findings on diagnostic imaging of breast: Secondary | ICD-10-CM | POA: Diagnosis not present

## 2024-05-18 DIAGNOSIS — R921 Mammographic calcification found on diagnostic imaging of breast: Secondary | ICD-10-CM | POA: Diagnosis not present

## 2024-05-18 DIAGNOSIS — R92321 Mammographic fibroglandular density, right breast: Secondary | ICD-10-CM | POA: Diagnosis not present

## 2024-06-06 ENCOUNTER — Telehealth: Admitting: Family Medicine

## 2024-06-06 ENCOUNTER — Encounter: Payer: Self-pay | Admitting: Family Medicine

## 2024-06-06 DIAGNOSIS — R41840 Attention and concentration deficit: Secondary | ICD-10-CM

## 2024-06-06 DIAGNOSIS — G4700 Insomnia, unspecified: Secondary | ICD-10-CM

## 2024-06-06 DIAGNOSIS — F419 Anxiety disorder, unspecified: Secondary | ICD-10-CM

## 2024-06-06 DIAGNOSIS — F321 Major depressive disorder, single episode, moderate: Secondary | ICD-10-CM

## 2024-06-06 MED ORDER — LISDEXAMFETAMINE DIMESYLATE 30 MG PO CAPS: 30.0000 mg | ORAL_CAPSULE | Freq: Every day | ORAL | 0 refills | Status: DC

## 2024-06-06 MED ORDER — LISDEXAMFETAMINE DIMESYLATE 30 MG PO CAPS
30.0000 mg | ORAL_CAPSULE | Freq: Every day | ORAL | 0 refills | Status: DC
Start: 2024-06-06 — End: 2024-08-28

## 2024-06-06 NOTE — Progress Notes (Signed)
 Name: Laura Watson   MRN: [id number]    DOB: Feb 11, 1981   Date:06/06/2024       Progress Note  Subjective:    Chief Complaint  Chief Complaint  Patient presents with   Medical Management of Chronic Issues    3 month follow-up   ADD    I connected with  Leita FORBES Blair Rosalva  on 06/06/24 at  3:00 PM EDT by a video enabled telemedicine application and verified that I am speaking with the correct person using two identifiers.  I discussed the limitations of evaluation and management by telemedicine and the availability of in person appointments. The patient expressed understanding and agreed to proceed. Staff also discussed with the patient that there may be a patient responsible charge related to this service. Patient Location: work office, private Provider Location: [facility] private Additional Individuals present: none  HPI Virtual f/up for med management of ADHD meds PDMP reviewed - no refills since last OV despite 3 post-dated rx as of May 13th PT on vyvanse  30 mg last Rx from April filled, meds ordered with last OV in May for may, June and July In the past the brand vs generic and supply of her meds has been an issue  She req refill on her app and never got meds She never called pharmacy or us  - unclear the issue  Doing well on lexapro  and seroquel  for sleep   Patient Active Problem List   Diagnosis Date Noted   Advanced care planning/counseling discussion 10/06/2023   Overweight 07/06/2023   Hyperlipidemia 07/06/2023   Pelvic pain 08/30/2022   Dermatitis 08/30/2022   Dysuria 08/30/2022   Pain in joint, forearm 08/30/2022   Screening for malignant neoplasm of cervix 08/30/2022   Sore throat 08/30/2022   Prediabetes 07/27/2022   Insomnia 01/27/2022   Allergic rhinitis 01/27/2022   Gastroesophageal reflux disease 01/27/2022   Attention deficit disorder 01/27/2022   ASCUS with positive high risk HPV cervical 08/07/2021   Class 1 obesity with serious  comorbidity and body mass index (BMI) of 33.0 to 33.9 in adult 04/09/2021   Vitamin D  deficiency 04/09/2021   Anxiety disorder 01/16/2018   MDD (recurrent major depressive disorder) in remission (HCC) 01/16/2018   Family history of diabetes mellitus 01/16/2018    Social History   Tobacco Use   Smoking status: Never   Smokeless tobacco: Never  Substance Use Topics   Alcohol use: Not Currently     Current Outpatient Medications:    acetaminophen  (TYLENOL ) 325 MG tablet, Take 650 mg by mouth as needed., Disp: , Rfl:    busPIRone  (BUSPAR ) 15 MG tablet, Take 0.5-1 tablets (7.5-15 mg total) by mouth 2 (two) times daily as needed., Disp: 180 tablet, Rfl: 3   Cholecalciferol (VITAMIN D3) 50 MCG (2000 UT) capsule, Take 1 capsule (2,000 Units total) by mouth daily., Disp: , Rfl:    clobetasol  ointment (TEMOVATE ) 0.05 %, Apply 1 Application topically 2 (two) times daily., Disp: 60 g, Rfl: 1   escitalopram  (LEXAPRO ) 20 MG tablet, Take 1 tablet (20 mg total) by mouth daily., Disp: 90 tablet, Rfl: 3   fluticasone  (FLONASE ) 50 MCG/ACT nasal spray, SHAKE LIQUID AND USE 2 SPRAYS IN EACH NOSTRIL DAILY, Disp: 16 g, Rfl: 5   ibuprofen (ADVIL,MOTRIN) 200 MG tablet, Take 400 mg by mouth as needed., Disp: , Rfl:    levonorgestrel  (MIRENA ) 20 MCG/24HR IUD, 1 Intra Uterine Device by Intrauterine route continuous., Disp: , Rfl:    lisdexamfetamine (  VYVANSE ) 30 MG capsule, Take 1 capsule (30 mg total) by mouth daily., Disp: 30 capsule, Rfl: 0   lisdexamfetamine (VYVANSE ) 30 MG capsule, Take 1 capsule (30 mg total) by mouth daily., Disp: 30 capsule, Rfl: 0   lisdexamfetamine (VYVANSE ) 30 MG capsule, Take 1 capsule (30 mg total) by mouth daily., Disp: 30 capsule, Rfl: 0   QUEtiapine  (SEROQUEL ) 25 MG tablet, Take 1 tablet (25 mg total) by mouth at bedtime., Disp: 90 tablet, Rfl: 1   WEGOVY 1.7 MG/0.75ML SOAJ, Inject 1.7 mg into the skin once a week., Disp: , Rfl:   No Known Allergies  I personally reviewed  active problem list, medication list, allergies, family history, social history, health maintenance, notes from last encounter, lab results, imaging with the patient/caregiver today.   Review of Systems  Constitutional: Negative.   HENT: Negative.    Eyes: Negative.   Respiratory: Negative.    Cardiovascular: Negative.   Gastrointestinal: Negative.   Endocrine: Negative.   Genitourinary: Negative.   Musculoskeletal: Negative.   Skin: Negative.   Allergic/Immunologic: Negative.   Neurological: Negative.   Hematological: Negative.   Psychiatric/Behavioral: Negative.    All other systems reviewed and are negative.     Objective:   Virtual encounter, vitals limited, only able to obtain the following There were no vitals filed for this visit. There is no height or weight on file to calculate BMI. Nursing Note and Vital Signs reviewed.  Physical Exam Vitals and nursing note reviewed.  Pulmonary:     Effort: No respiratory distress.  Psychiatric:        Behavior: Behavior normal.     PE limited by virtual encounter  No results found for this or any previous visit (from the past 72 hours).  Assessment and Plan:     ICD-10-CM   1. Attention or concentration deficit  R41.840 lisdexamfetamine (VYVANSE ) 30 MG capsule    lisdexamfetamine (VYVANSE ) 30 MG capsule    lisdexamfetamine (VYVANSE ) 30 MG capsule   doing well with meds, but still trouble getting the prescriptions filled, we wil call pharmacy directly today to confirm rx and they are able to fill/ dispense    2. Anxiety disorder, unspecified type  F41.9    doing well with current meds, lexapro  and seroquel , anxiety is usually worse without adhd med management however    3. Current moderate episode of major depressive disorder without prior episode (HCC)  F32.1    mood and sx still well controlled with lexapro     4. Insomnia, unspecified type  G47.00    seroquel  still effective to help manage insomnia, no SE or  concerns     Controlled substance database reviewed today and discussed with pt   Needs in office 3 month f/up   I provided 15 minutes of non-face-to-face time during this encounter.  Michelene Cower, PA-C 06/06/24 10:05 AM

## 2024-07-18 ENCOUNTER — Other Ambulatory Visit: Payer: Self-pay | Admitting: Family Medicine

## 2024-07-18 DIAGNOSIS — R21 Rash and other nonspecific skin eruption: Secondary | ICD-10-CM

## 2024-07-20 NOTE — Telephone Encounter (Signed)
 Requested medication (s) are due for refill today: yes  Requested medication (s) are on the active medication list: yes  Last refill:  07/06/23  Future visit scheduled: no  Notes to clinic:  Unable to refill per protocol, cannot delegate.      Requested Prescriptions  Pending Prescriptions Disp Refills   clobetasol  ointment (TEMOVATE ) 0.05 % [Pharmacy Med Name: CLOBETASOL  PROP 0.05% OINT 60GM] 60 g 1    Sig: APPLY TOPICALLY TO THE AFFECTED AREA TWICE DAILY     Not Delegated - Dermatology:  Corticosteroids Failed - 07/20/2024 10:52 AM      Failed - This refill cannot be delegated      Passed - Valid encounter within last 12 months    Recent Outpatient Visits           1 month ago Attention or concentration deficit   [facility] Leavy Mole, PA-C   4 months ago Anxiety disorder, unspecified type   [facility] Leavy Mole, PA-C   6 months ago Attention or concentration deficit   [facility] Leavy Mole, PA-C

## 2024-08-15 ENCOUNTER — Ambulatory Visit
Admission: EM | Admit: 2024-08-15 | Discharge: 2024-08-15 | Disposition: A | Discharge status: Home / Self Care | Attending: Family Medicine | Admitting: Family Medicine

## 2024-08-15 DIAGNOSIS — R509 Fever, unspecified: Secondary | ICD-10-CM

## 2024-08-15 DIAGNOSIS — R21 Rash and other nonspecific skin eruption: Secondary | ICD-10-CM | POA: Diagnosis not present

## 2024-08-15 LAB — POC COVID19/FLU A&B COMBO
Covid Antigen, POC: NEGATIVE
Influenza A Antigen, POC: NEGATIVE
Influenza B Antigen, POC: NEGATIVE

## 2024-08-15 LAB — POCT INFLUENZA A/B
Influenza A, POC: NEGATIVE
Influenza B, POC: NEGATIVE

## 2024-08-15 LAB — POCT RAPID STREP A (OFFICE): Rapid Strep A Screen: NEGATIVE

## 2024-08-15 NOTE — Discharge Instructions (Addendum)
 Advised patient to take OTC Allegra 180 mg fexofenadine daily for the next 3 days for rash.  Advised may take OTC Tylenol  1000 mg every 6 hours for fever (oral temperature greater than 100.3).  Encouraged to increase daily water intake to 64 ounces per day while taking these medications.  Advised if symptoms worsen and/or unresolved please follow-up with your PCP, dermatology, or here for further evaluation.

## 2024-08-15 NOTE — ED Provider Notes (Signed)
 [facility]    CSN: [id number] Arrival date & time: 08/15/24  9189      History   Chief Complaint Chief Complaint  Patient presents with   Rash   Fever    HPI Laura Watson is a 43 y.o. female.   HPI 43 year old female presents with chills, fever, and headache since yesterday and onset new rash this morning.  PMH significant for anxiety, HLD, and pelvic pain  Past Medical History:  Diagnosis Date   Anxiety    PONV (postoperative nausea and vomiting)     Patient Active Problem List   Diagnosis Date Noted   Advanced care planning/counseling discussion 10/06/2023   Overweight 07/06/2023   Hyperlipidemia 07/06/2023   Pelvic pain 08/30/2022   Dermatitis 08/30/2022   Dysuria 08/30/2022   Pain in joint, forearm 08/30/2022   Screening for malignant neoplasm of cervix 08/30/2022   Sore throat 08/30/2022   Prediabetes 07/27/2022   Insomnia 01/27/2022   Allergic rhinitis 01/27/2022   Gastroesophageal reflux disease 01/27/2022   Attention deficit disorder 01/27/2022   ASCUS with positive high risk HPV cervical 08/07/2021   Class 1 obesity with serious comorbidity and body mass index (BMI) of 33.0 to 33.9 in adult 04/09/2021   Vitamin D  deficiency 04/09/2021   Anxiety disorder 01/16/2018   MDD (recurrent major depressive disorder) in remission 01/16/2018   Family history of diabetes mellitus 01/16/2018    Past Surgical History:  Procedure Laterality Date   APPENDECTOMY  2009   BREAST REDUCTION SURGERY Bilateral 01/16/2020   Procedure: MAMMARY REDUCTION  (BREAST);  Surgeon: Lowery Estefana RAMAN, DO;  Location: [facility];  Service: Plastics;  Laterality: Bilateral;  3.5 hours   CESAREAN SECTION     x2   REDUCTION MAMMAPLASTY Bilateral 12/2019   WISDOM TOOTH EXTRACTION  07/01/2022    OB History     Gravida  2   Para  2   Term  2   Preterm      AB      Living  2      SAB      IAB      Ectopic      Multiple       Live Births  2            Home Medications    Prior to Admission medications   Medication Sig Start Date End Date Taking? Authorizing Provider  acetaminophen  (TYLENOL ) 325 MG tablet Take 650 mg by mouth as needed.    [provider]  busPIRone  (BUSPAR ) 15 MG tablet Take 0.5-1 tablets (7.5-15 mg total) by mouth 2 (two) times daily as needed. 12/06/22   Tapia, Leisa, PA-C  Cholecalciferol (VITAMIN D3) 50 MCG (2000 UT) capsule Take 1 capsule (2,000 Units total) by mouth daily. 07/23/21   Tapia, Leisa, PA-C  clobetasol  ointment (TEMOVATE ) 0.05 % APPLY TOPICALLY TO THE AFFECTED AREA TWICE DAILY 07/20/24   Sowles, Krichna, MD  escitalopram  (LEXAPRO ) 20 MG tablet Take 1 tablet (20 mg total) by mouth daily. 03/06/24   Tapia, Leisa, PA-C  fluticasone  (FLONASE ) 50 MCG/ACT nasal spray SHAKE LIQUID AND USE 2 SPRAYS IN EACH NOSTRIL DAILY 01/18/24   Tapia, Leisa, PA-C  ibuprofen (ADVIL,MOTRIN) 200 MG tablet Take 400 mg by mouth as needed.    [provider]  levonorgestrel  (MIRENA ) 20 MCG/24HR IUD 1 Intra Uterine Device by Intrauterine route continuous.    [provider]  lisdexamfetamine (VYVANSE ) 30 MG capsule Take 1 capsule (30  mg total) by mouth daily. 06/06/24   Tapia, Leisa, PA-C  lisdexamfetamine (VYVANSE ) 30 MG capsule Take 1 capsule (30 mg total) by mouth daily. 07/06/24   Tapia, Leisa, PA-C  lisdexamfetamine (VYVANSE ) 30 MG capsule Take 1 capsule (30 mg total) by mouth daily. 08/05/24   Tapia, Leisa, PA-C  QUEtiapine  (SEROQUEL ) 25 MG tablet Take 1 tablet (25 mg total) by mouth at bedtime. 03/06/24   Leavy Mole, PA-C  WEGOVY 1.7 MG/0.75ML SOAJ Inject 1.7 mg into the skin once a week. 02/25/24   [provider]    Family History Family History  Problem Relation Age of Onset   Diabetes Mother    Cancer Mother 38       lymphoma   Dementia Mother 8   Diabetes Father    Pulmonary fibrosis Father    Diabetes Sister    Diabetes Brother    Dementia Maternal  Grandmother    Alzheimer's disease Maternal Grandfather    Breast cancer Neg Hx     Social History Social History   Tobacco Use   Smoking status: Never   Smokeless tobacco: Never  Vaping Use   Vaping status: Never Used  Substance Use Topics   Alcohol use: Not Currently   Drug use: No     Allergies   Patient has no known allergies.   Review of Systems Review of Systems  Constitutional:  Positive for chills and fever.  Skin:  Positive for rash.       Bilateral neck: Redness and geographic patterns, nonpainful, nonpruritic per patient- please see images below  Neurological:  Positive for headaches.  All other systems reviewed and are negative.    Physical Exam Triage Vital Signs ED Triage Vitals  Encounter Vitals Group     BP 08/15/24 0833 116/82     Girls Systolic BP Percentile --      Girls Diastolic BP Percentile --      Boys Systolic BP Percentile --      Boys Diastolic BP Percentile --      Pulse Rate 08/15/24 0833 90     Resp 08/15/24 0833 19     Temp 08/15/24 0833 98.2 F (36.8 C)     Temp src --      SpO2 08/15/24 0833 98 %     Weight --      Height --      Head Circumference --      Peak Flow --      Pain Score 08/15/24 0832 4     Pain Loc --      Pain Education --      Exclude from Growth Chart --    No data found.  Updated Vital Signs BP 116/82   Pulse 90   Temp 98.2 F (36.8 C)   Resp 19   SpO2 98%    Physical Exam Vitals and nursing note reviewed.  Constitutional:      Appearance: Normal appearance.  HENT:     Head: Normocephalic and atraumatic.     Right Ear: Tympanic membrane, ear canal and external ear normal.     Left Ear: Tympanic membrane, ear canal and external ear normal.     Mouth/Throat:     Mouth: Mucous membranes are moist.     Pharynx: Oropharynx is clear.  Eyes:     Extraocular Movements: Extraocular movements intact.     Conjunctiva/sclera: Conjunctivae normal.     Pupils: Pupils are equal, round, and reactive  to light.  Cardiovascular:     Rate and Rhythm: Normal rate and regular rhythm.     Pulses: Normal pulses.     Heart sounds: Normal heart sounds.  Pulmonary:     Effort: Pulmonary effort is normal.     Breath sounds: Normal breath sounds. No wheezing, rhonchi or rales.  Musculoskeletal:        General: Normal range of motion.  Skin:    General: Skin is warm and dry.  Neurological:     General: No focal deficit present.     Mental Status: She is alert and oriented to person, place, and time. Mental status is at baseline.  Psychiatric:        Mood and Affect: Mood normal.        Behavior: Behavior normal.         UC Treatments / Results  Labs (all labs ordered are listed, but only abnormal results are displayed) Labs Reviewed  POCT RAPID STREP A (OFFICE) - Normal  POCT INFLUENZA A/B - Normal  POC COVID19/FLU A&B COMBO    EKG   Radiology No results found.  Procedures Procedures (including critical care time)  Medications Ordered in UC Medications - No data to display  Initial Impression / Assessment and Plan / UC Course  I have reviewed the triage vital signs and the nursing notes.  Pertinent labs & imaging results that were available during my care of the patient were reviewed by me and considered in my medical decision making (see chart for details).     MDM: 1.  Fever, unspecified-patient advised rapid strep, influenza A/B and COVID-19 all negative; 2.  Rash and nonspecific skin eruption-Advised patient to take OTC Allegra 180 mg fexofenadine daily for the next 3 days for rash.  Advised may take OTC Tylenol  1000 mg every 6 hours for fever (oral temperature greater than 100.3).  Encouraged to increase daily water intake to 64 ounces per day while taking these medications.  Advised if symptoms worsen and/or unresolved please follow-up with your PCP, dermatology, or here for further evaluation.  Patient discharged home, hemodynamically stable. Final Clinical  Impressions(s) / UC Diagnoses   Final diagnoses:  Rash and nonspecific skin eruption  Fever, unspecified     Discharge Instructions      Advised patient to take OTC Allegra 180 mg fexofenadine daily for the next 3 days for rash.  Advised may take OTC Tylenol  1000 mg every 6 hours for fever (oral temperature greater than 100.3).  Encouraged to increase daily water intake to 64 ounces per day while taking these medications.  Advised if symptoms worsen and/or unresolved please follow-up with your PCP, dermatology, or here for further evaluation.     ED Prescriptions   None    PDMP not reviewed this encounter.   Teddy Sharper, FNP 08/15/24 [phone]

## 2024-08-15 NOTE — ED Triage Notes (Addendum)
 Pt presents to uc with co chills, fever, headache since yesterday with new onset of rash this morning on neck. Pt reports otc tyelnol last night.   Rash is maculopapular assending up bilateral sides of her neck. Pt denies any itchiness. Right tonsil swollen and uvula red on exam. Pt denies any sore throat but does endorse a history of scarlet fever as a child.

## 2024-08-16 ENCOUNTER — Telehealth: Payer: Self-pay

## 2024-08-16 NOTE — Telephone Encounter (Signed)
 LMTRC

## 2024-08-17 LAB — CULTURE, GROUP A STREP (THRC)

## 2024-08-28 ENCOUNTER — Encounter: Payer: Self-pay | Admitting: Family Medicine

## 2024-08-28 ENCOUNTER — Telehealth: Admitting: Internal Medicine

## 2024-08-28 DIAGNOSIS — R41840 Attention and concentration deficit: Secondary | ICD-10-CM

## 2024-08-28 DIAGNOSIS — G4700 Insomnia, unspecified: Secondary | ICD-10-CM | POA: Diagnosis not present

## 2024-08-28 DIAGNOSIS — F339 Major depressive disorder, recurrent, unspecified: Secondary | ICD-10-CM

## 2024-08-28 MED ORDER — LISDEXAMFETAMINE DIMESYLATE 30 MG PO CAPS: 30.0000 mg | ORAL_CAPSULE | Freq: Every day | ORAL | 0 refills | Status: DC

## 2024-08-28 MED ORDER — QUETIAPINE FUMARATE 25 MG PO TABS
25.0000 mg | ORAL_TABLET | Freq: Every day | ORAL | 1 refills | Status: AC
Start: 2024-08-28 — End: ?

## 2024-08-28 NOTE — Progress Notes (Signed)
 Virtual Visit via Video Note  I connected with Laura Watson on 08/28/24 at  2:20 PM EST by a video enabled telemedicine application and verified that I am speaking with the correct person using two identifiers.  Location: Patient: Work (office) Provider: [facility]   I discussed the limitations of evaluation and management by telemedicine and the availability of in person appointments. The patient expressed understanding and agreed to proceed.  History of Present Illness:  Patient is presenting via telemedicine for medication refills. This is my first time meeting her.   Discussed the use of AI scribe software for clinical note transcription with the patient, who gave verbal consent to proceed.  History of Present Illness [name] is a 43 year old female with ADHD who presents for a medication refill.  She has been taking Vyvanse  30 mg daily since her ADHD diagnosis at a behavioral center. She experiences no side effects such as sleep disturbances or appetite changes and does not have a 'come down' effect.  Her current medications also include Lexapro , which she is tolerating well, and Seroquel , taken at bedtime. She has a prescription for Buspar  for anxiety but does not need a refill at this time.   ADHD: -Currently on Vyvanse  30 mg daily, has never been on anything else for ADHD and has never had to change the dose -Compliant with medication and denies side effects -Diagnosed as an adult a few years go - at the [facility] in [place]    Observations/Objective:  General: well appearing, no acute distress ENT: conjunctiva normal appearing bilaterally  Skin: no rashes, cyanosis or abnormal bruising noted Neuro: answers all questions appropriately   Assessment and Plan:  Assessment & Plan Attention-deficit hyperactivity disorder (ADHD) ADHD managed with Vyvanse  30 mg daily, effective without side effects. - Continue Vyvanse  30 mg  daily. - Scheduled follow-up in three months for labs and medication review.  Major depressive disorder/Insomnia Managed with Lexapro  and Seroquel . Lexapro  well-tolerated. Seroquel  due for refill. - Continue Lexapro  as prescribed. - Refilled Seroquel  prescription and sent to [facility] on [address] and [address].  General Health Maintenance Due for blood work at the end of the year. - Scheduled in-person visit in three months for labs and annual checkup.  - lisdexamfetamine (VYVANSE ) 30 MG capsule; Take 1 capsule (30 mg total) by mouth daily.  Dispense: 30 capsule; Refill: 0 - lisdexamfetamine (VYVANSE ) 30 MG capsule; Take 1 capsule (30 mg total) by mouth daily.  Dispense: 30 capsule; Refill: 0 - lisdexamfetamine (VYVANSE ) 30 MG capsule; Take 1 capsule (30 mg total) by mouth daily.  Dispense: 30 capsule; Refill: 0 - QUEtiapine  (SEROQUEL ) 25 MG tablet; Take 1 tablet (25 mg total) by mouth at bedtime.  Dispense: 90 tablet; Refill: 1   Follow Up Instructions: 3 months in person    I discussed the assessment and treatment plan with the patient. The patient was provided an opportunity to ask questions and all were answered. The patient agreed with the plan and demonstrated an understanding of the instructions.   The patient was advised to call back or seek an in-person evaluation if the symptoms worsen or if the condition fails to improve as anticipated.  I provided 8 minutes of non-face-to-face time during this encounter.   Sharyle Fischer, DO

## 2024-11-29 ENCOUNTER — Other Ambulatory Visit: Payer: Self-pay

## 2024-11-29 ENCOUNTER — Ambulatory Visit: Admitting: Internal Medicine

## 2024-11-29 ENCOUNTER — Encounter: Payer: Self-pay | Admitting: Internal Medicine

## 2024-11-29 VITALS — BP 120/68 | HR 87 | Temp 97.7°F | Resp 16 | Ht 63.0 in | Wt 131.9 lb

## 2024-11-29 DIAGNOSIS — Z1231 Encounter for screening mammogram for malignant neoplasm of breast: Secondary | ICD-10-CM

## 2024-11-29 DIAGNOSIS — F3340 Major depressive disorder, recurrent, in remission, unspecified: Secondary | ICD-10-CM

## 2024-11-29 DIAGNOSIS — R41840 Attention and concentration deficit: Secondary | ICD-10-CM

## 2024-11-29 DIAGNOSIS — F419 Anxiety disorder, unspecified: Secondary | ICD-10-CM | POA: Diagnosis not present

## 2024-11-29 DIAGNOSIS — Z13220 Encounter for screening for lipoid disorders: Secondary | ICD-10-CM

## 2024-11-29 MED ORDER — LISDEXAMFETAMINE DIMESYLATE 30 MG PO CAPS: 30.0000 mg | ORAL_CAPSULE | Freq: Every day | ORAL | 0 refills | Status: AC

## 2024-11-29 NOTE — Progress Notes (Signed)
 "  Established Patient Office Visit  Subjective   Patient ID: Laura Watson, female    DOB: 1981-10-17  Age: 44 y.o. MRN: [id number]  Chief Complaint  Patient presents with   Medical Management of Chronic Issues    3 month recheck    HPI  Discussed the use of AI scribe software for clinical note transcription with the patient, who gave verbal consent to proceed.  History of Present Illness Mrs. Lucianna Ostlund is a 44 year old female who presents for a routine checkup and medication review.  She is on a stable Vyvanse  regimen for ADHD for several years and notes clear symptom worsening when she is off the medication.  Her psychiatric medications include Seroquel  (prescribed in November with a six-month supply) and Lexapro  (filled for a year in May). She has no issues with Lexapro . She uses Buspar  as needed for anxiety and uses it infrequently, with the last refill in February 2024.  She participates in a workplace weight management program and is on Wegovy through a [facility].  She had a mammogram in July with a repeat in September for right breast calcifications that showed slight changes.  She is up to date on her tetanus vaccine.   ADHD: -Currently on Vyvanse  30 mg daily, has never been on anything else for ADHD and has never had to change the dose -Compliant with medication and denies side effects -Diagnosed as an adult a few years go - at the [facility] in [place]  MDD: -Mood status: controlled -Current treatment: Lexapro  20 mg, Buspar  15 mg BID, Seroquel  25 mg -Satisfied with current treatment?: yes -Duration of current treatment : chronic -Side effects: no Medication compliance: excellent compliance     11/29/2024    1:39 PM 06/06/2024    2:13 PM 03/06/2024   11:22 AM 01/18/2024    1:20 PM 10/06/2023    8:47 AM  Depression screen PHQ 2/9  Decreased Interest 0 0 0 0 0  Down, Depressed, Hopeless 0 0 0 0 0  PHQ -  2 Score 0 0 0 0 0  Altered sleeping  0 1 1 1   Tired, decreased energy  0 1 1 1   Change in appetite  0 0 0 0  Feeling bad or failure about yourself   0 0 0 0  Trouble concentrating  0 0 0 0  Moving slowly or fidgety/restless  0 0 0 0  Suicidal thoughts  0 0 0 0  PHQ-9 Score  0  2  2  2    Difficult doing work/chores   Not difficult at all       Data saved with a previous flowsheet row definition   Weight Loss:  -Currently on Wegovy 1.7 mg for maintenance through weight watchers. Did very well and lost over 70 pounds  Health Maintenance: -Blood work due -Mammogram 7/25, repeat due  Patient Active Problem List   Diagnosis Date Noted   Advanced care planning/counseling discussion 10/06/2023   Overweight 07/06/2023   Hyperlipidemia 07/06/2023   Pelvic pain 08/30/2022   Dermatitis 08/30/2022   Dysuria 08/30/2022   Pain in joint, forearm 08/30/2022   Screening for malignant neoplasm of cervix 08/30/2022   Sore throat 08/30/2022   Prediabetes 07/27/2022   Insomnia 01/27/2022   Allergic rhinitis 01/27/2022   Gastroesophageal reflux disease 01/27/2022   Attention deficit disorder 01/27/2022   ASCUS with positive high risk HPV cervical 08/07/2021   Class 1 obesity with serious  comorbidity and body mass index (BMI) of 33.0 to 33.9 in adult 04/09/2021   Vitamin D  deficiency 04/09/2021   Anxiety disorder 01/16/2018   MDD (recurrent major depressive disorder) in remission 01/16/2018   Family history of diabetes mellitus 01/16/2018   Past Medical History:  Diagnosis Date   Anxiety    PONV (postoperative nausea and vomiting)    Past Surgical History:  Procedure Laterality Date   APPENDECTOMY  2009   BREAST REDUCTION SURGERY Bilateral 01/16/2020   Procedure: MAMMARY REDUCTION  (BREAST);  Surgeon: Lowery Estefana RAMAN, DO;  Location: [facility];  Service: Plastics;  Laterality: Bilateral;  3.5 hours   CESAREAN SECTION     x2   REDUCTION MAMMAPLASTY Bilateral 12/2019    WISDOM TOOTH EXTRACTION  07/01/2022   Social History[1] Social History   Socioeconomic History   Marital status: Married    Spouse name: Not on file   Number of children: 2   Years of education: Not on file   Highest education level: 12th grade  Occupational History   Not on file  Tobacco Use   Smoking status: Never   Smokeless tobacco: Never  Vaping Use   Vaping status: Never Used  Substance and Sexual Activity   Alcohol use: Not Currently   Drug use: No   Sexual activity: Yes    Partners: Male    Birth control/protection: I.U.D.    Comment: Mirena   Other Topics Concern   Not on file  Social History Narrative   Works at [employer] in [employer].   Previously divorced, 2 kids senior high school, 51 y/o junior in college   Getting married Nov   Social Drivers of Health   Tobacco Use: Low Risk (11/29/2024)   Patient History    Smoking Tobacco Use: Never    Smokeless Tobacco Use: Never    Passive Exposure: Not on file  Financial Resource Strain: Low Risk (10/06/2023)   Overall Financial Resource Strain (CARDIA)    Difficulty of Paying Living Expenses: Not very hard  Food Insecurity: No Food Insecurity (10/06/2023)   Hunger Vital Sign    Worried About Running Out of Food in the Last Year: Never true    Ran Out of Food in the Last Year: Never true  Transportation Needs: No Transportation Needs (10/06/2023)   PRAPARE - Administrator, Civil Service (Medical): No    Lack of Transportation (Non-Medical): No  Physical Activity: Insufficiently Active (10/06/2023)   Exercise Vital Sign    Days of Exercise per Week: 3 days    Minutes of Exercise per Session: 20 min  Stress: Stress Concern Present (10/06/2023)   [employer] of Occupational Health - Occupational Stress Questionnaire    Feeling of Stress : To some extent  Social Connections: Moderately Isolated (10/06/2023)   Social Connection and Isolation Panel     Frequency of Communication with Friends and Family: Three times a week    Frequency of Social Gatherings with Friends and Family: Never    Attends Religious Services: Never    Database Administrator or Organizations: No    Attends Engineer, Structural: Not on file    Marital Status: Married  Catering Manager Violence: Not At Risk (10/06/2023)   Humiliation, Afraid, Rape, and Kick questionnaire    Fear of Current or Ex-Partner: No    Emotionally Abused: No    Physically Abused: No    Sexually Abused: No  Depression (PHQ2-9): Low Risk (  11/29/2024)   Depression (PHQ2-9)    PHQ-2 Score: 0  Alcohol Screen: Low Risk (10/06/2023)   Alcohol Screen    Last Alcohol Screening Score (AUDIT): 1  Housing: Low Risk (10/06/2023)   Housing Stability Vital Sign    Unable to Pay for Housing in the Last Year: No    Number of Times Moved in the Last Year: 0    Homeless in the Last Year: No  Utilities: Not At Risk (10/06/2023)   AHC Utilities    Threatened with loss of utilities: No  Health Literacy: Adequate Health Literacy (10/06/2023)   B1300 Health Literacy    Frequency of need for help with medical instructions: Never   Family Status  Relation Name Status   Mother  Alive   Father  Deceased   Sister Ruthanna Alive   Sister Devere Alive   Brother [place] Alive   Brother [place] Alive   Brother Francis Alive   MGM  Deceased   MGF  Deceased   PGM  Deceased   PGF  Deceased   Neg Hx  (Not Specified)  No partnership data on file   Family History  Problem Relation Age of Onset   Diabetes Mother    Cancer Mother 82       lymphoma   Dementia Mother 22   Diabetes Father    Pulmonary fibrosis Father    Diabetes Sister    Diabetes Brother    Dementia Maternal Grandmother    Alzheimer's disease Maternal Grandfather    Breast cancer Neg Hx    Allergies[2]    Review of Systems  All other systems reviewed and are negative.     Objective:     BP 120/68 (Cuff Size: Large)   Pulse 87    Temp 97.7 F (36.5 C) (Oral)   Resp 16   Ht 5' 3 (1.6 m)   Wt 131 lb 14.4 oz (59.8 kg)   SpO2 99%   BMI 23.37 kg/m  BP Readings from Last 3 Encounters:  11/29/24 120/68  08/15/24 116/82  03/06/24 128/82   Wt Readings from Last 3 Encounters:  11/29/24 131 lb 14.4 oz (59.8 kg)  03/06/24 134 lb (60.8 kg)  01/18/24 145 lb (65.8 kg)      Physical Exam Constitutional:      Appearance: Normal appearance.  HENT:     Head: Normocephalic and atraumatic.     Mouth/Throat:     Mouth: Mucous membranes are moist.     Pharynx: Oropharynx is clear.  Eyes:     Extraocular Movements: Extraocular movements intact.     Conjunctiva/sclera: Conjunctivae normal.     Pupils: Pupils are equal, round, and reactive to light.  Neck:     Comments: No thyromegaly Cardiovascular:     Rate and Rhythm: Normal rate and regular rhythm.  Pulmonary:     Effort: Pulmonary effort is normal.     Breath sounds: Normal breath sounds.  Musculoskeletal:     Cervical back: No tenderness.     Right lower leg: No edema.     Left lower leg: No edema.  Lymphadenopathy:     Cervical: No cervical adenopathy.  Skin:    General: Skin is warm and dry.  Neurological:     General: No focal deficit present.     Mental Status: She is alert. Mental status is at baseline.  Psychiatric:        Mood and Affect: Mood normal.  Behavior: Behavior normal.      No results found for any visits on 11/29/24.  Last CBC Lab Results  Component Value Date   WBC 8.2 10/07/2023   HGB 13.1 10/07/2023   HCT 39.0 10/07/2023   MCV 91 10/07/2023   MCH 30.4 10/07/2023   RDW 12.8 10/07/2023   PLT 406 10/07/2023   Last metabolic panel Lab Results  Component Value Date   GLUCOSE 79 10/07/2023   NA 140 10/07/2023   K 4.1 10/07/2023   CL 105 10/07/2023   CO2 20 10/07/2023   BUN 7 10/07/2023   CREATININE 0.75 10/07/2023   EGFR 101 10/07/2023   CALCIUM 9.4 10/07/2023   PROT 6.8 10/07/2023   ALBUMIN 4.5 10/07/2023    LABGLOB 2.3 10/07/2023   AGRATIO 1.9 08/06/2022   BILITOT 0.7 10/07/2023   ALKPHOS 72 10/07/2023   AST 15 10/07/2023   ALT 11 10/07/2023   ANIONGAP 9 07/07/2017   Last lipids Lab Results  Component Value Date   CHOL 149 10/07/2023   HDL 43 10/07/2023   LDLCALC 94 10/07/2023   TRIG 57 10/07/2023   CHOLHDL 3.5 10/07/2023   Last hemoglobin A1c Lab Results  Component Value Date   HGBA1C 5.1 10/07/2023   Last thyroid  functions Lab Results  Component Value Date   TSH 1.380 10/07/2023   Last vitamin D  Lab Results  Component Value Date   VD25OH 39 02/10/2021   Last vitamin B12 and Folate No results found for: VITAMINB12, FOLATE    The 10-year ASCVD risk score (Arnett DK, et al., 2019) is: 0.6%    Assessment & Plan:   Assessment & Plan Attention-deficit hyperactivity disorder ADHD well-managed with Vyvanse  30 mg daily, effective per patient report. - Continue Vyvanse  30 mg oral daily. - Scheduled virtual follow-up in three months for prescription management.  Major depressive disorder Managed with Lexapro  and Seroquel . Adequate supply of Buspar  for as-needed use. - Continue Lexapro  as prescribed. - Continue Seroquel  as prescribed. - Ensure Buspar  is available for as-needed use.  General Health Maintenance Routine health maintenance up to date. Benign changes in right breast calcifications on mammogram, follow-up required. - Ordered follow-up mammogram for January 2026. - Performed routine blood work including kidney, liver, electrolytes, and cholesterol. - Offered flu vaccine if available. - Scheduled annual physical exam.  - CBC w/Diff/Platelet - Comprehensive Metabolic Panel (CMET) - lisdexamfetamine  (VYVANSE ) 30 MG capsule; Take 1 capsule (30 mg total) by mouth daily.  Dispense: 30 capsule; Refill: 0 - lisdexamfetamine  (VYVANSE ) 30 MG capsule; Take 1 capsule (30 mg total) by mouth daily.  Dispense: 30 capsule; Refill: 0 - lisdexamfetamine  (VYVANSE ) 30  MG capsule; Take 1 capsule (30 mg total) by mouth daily.  Dispense: 30 capsule; Refill: 0 - Lipid Profile - MM 3D SCREENING MAMMOGRAM BILATERAL BREAST; Future   Return in about 3 months (around 02/26/2025) for virtual for medication recheck .    Sharyle Fischer, DO    [1]  Social History Tobacco Use   Smoking status: Never   Smokeless tobacco: Never  Vaping Use   Vaping status: Never Used  Substance Use Topics   Alcohol use: Not Currently   Drug use: No  [2] No Known Allergies  "

## 2024-11-30 ENCOUNTER — Ambulatory Visit: Payer: Self-pay | Admitting: Internal Medicine

## 2024-11-30 LAB — COMPREHENSIVE METABOLIC PANEL WITH GFR
AG Ratio: 2 (calc) (ref 1.0–2.5)
ALT: 11 U/L (ref 6–29)
AST: 14 U/L (ref 10–30)
Albumin: 4.7 g/dL (ref 3.6–5.1)
Alkaline phosphatase (APISO): 46 U/L (ref 31–125)
BUN: 7 mg/dL (ref 7–25)
CO2: 26 mmol/L (ref 20–32)
Calcium: 9.3 mg/dL (ref 8.6–10.2)
Chloride: 104 mmol/L (ref 98–110)
Creat: 0.72 mg/dL (ref 0.50–0.99)
Globulin: 2.4 g/dL (ref 1.9–3.7)
Glucose, Bld: 77 mg/dL (ref 65–99)
Potassium: 4.5 mmol/L (ref 3.5–5.3)
Sodium: 139 mmol/L (ref 135–146)
Total Bilirubin: 0.8 mg/dL (ref 0.2–1.2)
Total Protein: 7.1 g/dL (ref 6.1–8.1)
eGFR: 106 mL/min/{1.73_m2}

## 2024-11-30 LAB — CBC WITH DIFFERENTIAL/PLATELET
Absolute Lymphocytes: 2784 {cells}/uL (ref 850–3900)
Absolute Monocytes: 360 {cells}/uL (ref 200–950)
Basophils Absolute: 112 {cells}/uL (ref 0–200)
Basophils Relative: 1.4 %
Eosinophils Absolute: 232 {cells}/uL (ref 15–500)
Eosinophils Relative: 2.9 %
HCT: 40.4 % (ref 35.9–46.0)
Hemoglobin: 13.6 g/dL (ref 11.7–15.5)
MCH: 31.4 pg (ref 27.0–33.0)
MCHC: 33.7 g/dL (ref 31.6–35.4)
MCV: 93.3 fL (ref 81.4–101.7)
MPV: 10.4 fL (ref 7.5–12.5)
Monocytes Relative: 4.5 %
Neutro Abs: 4512 {cells}/uL (ref 1500–7800)
Neutrophils Relative %: 56.4 %
Platelets: 406 10*3/uL — ABNORMAL HIGH (ref 140–400)
RBC: 4.33 Million/uL (ref 3.80–5.10)
RDW: 12.4 % (ref 11.0–15.0)
Total Lymphocyte: 34.8 %
WBC: 8 10*3/uL (ref 3.8–10.8)

## 2024-11-30 LAB — LIPID PANEL
Cholesterol: 167 mg/dL
HDL: 56 mg/dL
LDL Cholesterol (Calc): 92 mg/dL
Non-HDL Cholesterol (Calc): 111 mg/dL
Total CHOL/HDL Ratio: 3 (calc)
Triglycerides: 97 mg/dL

## 2025-02-26 ENCOUNTER — Ambulatory Visit: Admitting: Internal Medicine
